# Patient Record
Sex: Male | Born: 1980
Health system: Southern US, Community
[De-identification: ages and names within clinical notes are randomized; demographics above are authoritative.]

## PROBLEM LIST (undated history)

## (undated) DIAGNOSIS — T7840XA Allergy, unspecified, initial encounter: Secondary | ICD-10-CM

## (undated) DIAGNOSIS — F988 Other specified behavioral and emotional disorders with onset usually occurring in childhood and adolescence: Secondary | ICD-10-CM

## (undated) DIAGNOSIS — I10 Essential (primary) hypertension: Secondary | ICD-10-CM

## (undated) HISTORY — DX: Essential (primary) hypertension: I10

## (undated) HISTORY — PX: INGUINAL HERNIA REPAIR: SHX194

## (undated) HISTORY — DX: Allergy, unspecified, initial encounter: T78.40XA

## (undated) HISTORY — DX: Other specified behavioral and emotional disorders with onset usually occurring in childhood and adolescence: F98.8

---

## 2002-07-10 ENCOUNTER — Emergency Department (HOSPITAL_COMMUNITY): Admission: EM | Admit: 2002-07-10 | Discharge: 2002-07-11 | Payer: Self-pay | Admitting: Emergency Medicine

## 2003-06-03 ENCOUNTER — Emergency Department (HOSPITAL_COMMUNITY): Admission: AD | Admit: 2003-06-03 | Discharge: 2003-06-03 | Payer: Self-pay | Admitting: *Deleted

## 2003-06-09 ENCOUNTER — Ambulatory Visit (HOSPITAL_BASED_OUTPATIENT_CLINIC_OR_DEPARTMENT_OTHER): Admission: RE | Admit: 2003-06-09 | Discharge: 2003-06-09 | Payer: Self-pay | Admitting: General Surgery

## 2004-06-09 ENCOUNTER — Emergency Department (HOSPITAL_COMMUNITY): Admission: EM | Admit: 2004-06-09 | Discharge: 2004-06-10 | Payer: Self-pay | Admitting: Emergency Medicine

## 2004-10-22 ENCOUNTER — Emergency Department (HOSPITAL_COMMUNITY): Admission: EM | Admit: 2004-10-22 | Discharge: 2004-10-22 | Payer: Self-pay | Admitting: Emergency Medicine

## 2006-01-28 ENCOUNTER — Ambulatory Visit: Payer: Self-pay | Admitting: Internal Medicine

## 2006-03-05 ENCOUNTER — Ambulatory Visit: Payer: Self-pay | Admitting: Internal Medicine

## 2008-01-11 ENCOUNTER — Ambulatory Visit: Payer: Self-pay | Admitting: Internal Medicine

## 2008-01-11 DIAGNOSIS — F988 Other specified behavioral and emotional disorders with onset usually occurring in childhood and adolescence: Secondary | ICD-10-CM | POA: Insufficient documentation

## 2008-01-11 LAB — CONVERTED CEMR LAB
ALT: 35 units/L (ref 0–53)
AST: 53 units/L — ABNORMAL HIGH (ref 0–37)
Albumin: 4.4 g/dL (ref 3.5–5.2)
Alkaline Phosphatase: 66 units/L (ref 39–117)
BUN: 13 mg/dL (ref 6–23)
Basophils Absolute: 0.1 10*3/uL (ref 0.0–0.1)
Basophils Relative: 0.9 % (ref 0.0–1.0)
Bilirubin Urine: NEGATIVE
Bilirubin, Direct: 0.1 mg/dL (ref 0.0–0.3)
CO2: 30 meq/L (ref 19–32)
Calcium: 9.4 mg/dL (ref 8.4–10.5)
Chloride: 101 meq/L (ref 96–112)
Cholesterol: 153 mg/dL (ref 0–200)
Creatinine, Ser: 1 mg/dL (ref 0.4–1.5)
Eosinophils Absolute: 0.6 10*3/uL (ref 0.0–0.7)
Eosinophils Relative: 9.5 % — ABNORMAL HIGH (ref 0.0–5.0)
GFR calc Af Amer: 117 mL/min
GFR calc non Af Amer: 97 mL/min
Glucose, Bld: 87 mg/dL (ref 70–99)
HCT: 42.7 % (ref 39.0–52.0)
HDL: 61.6 mg/dL (ref >39.0)
Hemoglobin, Urine: NEGATIVE
Hemoglobin: 15.1 g/dL (ref 13.0–17.0)
Ketones, ur: NEGATIVE mg/dL
LDL Cholesterol: 79 mg/dL (ref 0–99)
Leukocytes, UA: NEGATIVE
Lymphocytes Relative: 28.3 % (ref 12.0–46.0)
MCHC: 35.3 g/dL (ref 30.0–36.0)
MCV: 86.6 fL (ref 78.0–100.0)
Monocytes Absolute: 0.7 10*3/uL (ref 0.1–1.0)
Monocytes Relative: 11 % (ref 3.0–12.0)
Neutro Abs: 3.5 10*3/uL (ref 1.4–7.7)
Neutrophils Relative %: 50.3 % (ref 43.0–77.0)
Nitrite: NEGATIVE
Platelets: 199 10*3/uL (ref 150–400)
Potassium: 3.8 meq/L (ref 3.5–5.1)
RBC: 4.93 M/uL (ref 4.22–5.81)
RDW: 12.7 % (ref 11.5–14.6)
Sodium: 139 meq/L (ref 135–145)
Specific Gravity, Urine: 1.01 (ref 1.000–1.03)
TSH: 1.59 microintl units/mL (ref 0.35–5.50)
Total Bilirubin: 0.8 mg/dL (ref 0.3–1.2)
Total CHOL/HDL Ratio: 2.5
Total Protein, Urine: NEGATIVE mg/dL
Total Protein: 7.5 g/dL (ref 6.0–8.3)
Triglycerides: 61 mg/dL (ref 0–149)
Urine Glucose: NEGATIVE mg/dL
Urobilinogen, UA: 0.2 (ref 0.0–1.0)
VLDL: 12 mg/dL (ref 0–40)
WBC: 6.8 10*3/uL (ref 4.5–10.5)
pH: 6.5 (ref 5.0–8.0)

## 2008-01-12 LAB — CONVERTED CEMR LAB

## 2008-01-28 ENCOUNTER — Telehealth (INDEPENDENT_AMBULATORY_CARE_PROVIDER_SITE_OTHER): Payer: Self-pay | Admitting: *Deleted

## 2008-02-02 ENCOUNTER — Ambulatory Visit: Payer: Self-pay | Admitting: Endocrinology

## 2008-02-02 DIAGNOSIS — N342 Other urethritis: Secondary | ICD-10-CM | POA: Insufficient documentation

## 2008-02-03 ENCOUNTER — Encounter: Payer: Self-pay | Admitting: Internal Medicine

## 2008-02-03 ENCOUNTER — Telehealth: Payer: Self-pay | Admitting: Endocrinology

## 2008-02-15 ENCOUNTER — Telehealth (INDEPENDENT_AMBULATORY_CARE_PROVIDER_SITE_OTHER): Payer: Self-pay | Admitting: *Deleted

## 2008-09-15 ENCOUNTER — Emergency Department (HOSPITAL_COMMUNITY): Admission: EM | Admit: 2008-09-15 | Discharge: 2008-09-15 | Payer: Self-pay | Admitting: Emergency Medicine

## 2009-02-21 ENCOUNTER — Ambulatory Visit: Payer: Self-pay | Admitting: Internal Medicine

## 2009-02-21 DIAGNOSIS — R1084 Generalized abdominal pain: Secondary | ICD-10-CM | POA: Insufficient documentation

## 2009-02-21 DIAGNOSIS — M549 Dorsalgia, unspecified: Secondary | ICD-10-CM | POA: Insufficient documentation

## 2009-02-21 LAB — CONVERTED CEMR LAB
ALT: 11 units/L (ref 0–53)
AST: 13 units/L (ref 0–37)
Albumin: 4 g/dL (ref 3.5–5.2)
Alkaline Phosphatase: 54 units/L (ref 39–117)
BUN: 15 mg/dL (ref 6–23)
Basophils Absolute: 0 10*3/uL (ref 0.0–0.1)
Basophils Relative: 0.1 % (ref 0.0–3.0)
Bilirubin Urine: NEGATIVE
Bilirubin, Direct: 0.2 mg/dL (ref 0.0–0.3)
CO2: 31 meq/L (ref 19–32)
Calcium: 9.4 mg/dL (ref 8.4–10.5)
Chloride: 102 meq/L (ref 96–112)
Creatinine, Ser: 1 mg/dL (ref 0.4–1.5)
Eosinophils Absolute: 0.3 10*3/uL (ref 0.0–0.7)
Eosinophils Relative: 3.4 % (ref 0.0–5.0)
GFR calc non Af Amer: 95.48 mL/min (ref >60)
Glucose, Bld: 94 mg/dL (ref 70–99)
HCT: 41.8 % (ref 39.0–52.0)
Hemoglobin, Urine: NEGATIVE
Hemoglobin: 14.3 g/dL (ref 13.0–17.0)
Ketones, ur: NEGATIVE mg/dL
Leukocytes, UA: NEGATIVE
Lipase: 6 units/L — ABNORMAL LOW (ref 11.0–59.0)
Lymphocytes Relative: 18.3 % (ref 12.0–46.0)
Lymphs Abs: 1.8 10*3/uL (ref 0.7–4.0)
MCHC: 34.3 g/dL (ref 30.0–36.0)
MCV: 85.9 fL (ref 78.0–100.0)
Mono Screen: NEGATIVE
Monocytes Absolute: 0.9 10*3/uL (ref 0.1–1.0)
Monocytes Relative: 9 % (ref 3.0–12.0)
Neutro Abs: 6.7 10*3/uL (ref 1.4–7.7)
Neutrophils Relative %: 69.2 % (ref 43.0–77.0)
Nitrite: NEGATIVE
Platelets: 199 10*3/uL (ref 150.0–400.0)
Potassium: 4.3 meq/L (ref 3.5–5.1)
RBC: 4.87 M/uL (ref 4.22–5.81)
RDW: 12.3 % (ref 11.5–14.6)
Sodium: 139 meq/L (ref 135–145)
Specific Gravity, Urine: 1.02 (ref 1.000–1.030)
Total Bilirubin: 0.8 mg/dL (ref 0.3–1.2)
Total Protein, Urine: NEGATIVE mg/dL
Total Protein: 7.1 g/dL (ref 6.0–8.3)
Urine Glucose: NEGATIVE mg/dL
Urobilinogen, UA: 0.2 (ref 0.0–1.0)
WBC: 9.7 10*3/uL (ref 4.5–10.5)
pH: 7 (ref 5.0–8.0)

## 2010-11-13 LAB — URINALYSIS, ROUTINE W REFLEX MICROSCOPIC
Bilirubin Urine: NEGATIVE
Glucose, UA: NEGATIVE mg/dL
Hgb urine dipstick: NEGATIVE
Ketones, ur: NEGATIVE mg/dL
Nitrite: NEGATIVE
Protein, ur: NEGATIVE mg/dL
Specific Gravity, Urine: 1.025 (ref 1.005–1.030)
Urobilinogen, UA: 0.2 mg/dL (ref 0.0–1.0)
pH: 6.5 (ref 5.0–8.0)

## 2010-11-13 LAB — GC/CHLAMYDIA PROBE AMP, GENITAL
Chlamydia, DNA Probe: NEGATIVE
GC Probe Amp, Genital: NEGATIVE

## 2010-12-14 NOTE — Op Note (Signed)
Clayton Olson, Clayton Olson                         ACCOUNT NO.:  [id number]   MEDICAL RECORD NO.:  [id number]                   PATIENT TYPE:  AMB   LOCATION:  DSC                                  FACILITY:  [facility]   PHYSICIAN:  Anselm Pancoast. Zachery Dakins, M.D.          DATE OF BIRTH:  February 13, 1981   DATE OF PROCEDURE:  06/09/2003  DATE OF DISCHARGE:                                 OPERATIVE REPORT   PREOPERATIVE DIAGNOSIS:  Right inguinal hernia, indirect.   POSTOPERATIVE DIAGNOSIS:  Right inguinal hernia, indirect.   PROCEDURE:  Right inguinal herniorrhaphy with mesh reinforcement.   ANESTHESIA:  General anesthesia with local supplement.   SURGEON:  Anselm Pancoast. Zachery Dakins, M.D.   HISTORY:  The patient is a 30 year old Caucasian male who was referred to  our office for a symptomatic right inguinal hernia.  The patient states that  he works in a [employer] but recently has started doing  weight lifting and has noticed a bulge in the right groin.  This is an  indirect hernia that reduces easily and he said that he has been having pain  and a little bit of discomfort in the groin for approximately a year.  Recently, he had a cough that was a lot more symptomatic in the right groin  and this is what prompted him to have the herniorrhaphy at this time.  On  physical examination I could feel a definite bulge with straining in the  right groin that reduces spontaneously.  He has a very positive family  history of the males having hernias at young ages.   DESCRIPTION OF PROCEDURE:  The patient preoperatively was given a gram of  Kefzol and taken to the operative suite.  He only weighs about 120 pounds.  An LOA tube was used.  The groin was first clipped and then prepped with  Betadine surgical solution.  A small inguinal incision was made and sharp  dissection down through the skin, subcutaneous and Scarpa's fascia.  The  superficial vein was clamped, divided and ligated with fine  Vicryl.  The  external oblique aponeurosis was opened through the external ring.  There  was a little ilioinguinal nerve that was protected and I elevated the cord  structures.  The floor was reasonably strong.  You could see a little  indirect sac coming down with the cord structures and this was separated in  the medial superior aspect and then the hernia sac which was about three  inches in length was opened and a high sac ligation was performed with 2-0  silk.  The sac was removed and retracted nicely.  I then used about three  sutures of 2-0 silk to kind of reinforce the internal ring.  I then took a  small piece of Prolene mesh shaped like a sail slit laterally to reinforce  the floor.  This was sutured to dartos and symphysis pubis with a  running 2-  0 Prolene at the inguinal ligament and then the superior flap was sutured  with interrupted sutures of 2-0 Vicryl so that he will not feel the little  knots in this area.  The cord structures were lying comfortably.  The two  tails were kind of reinforced to the internal ring and the ilioinguinal  nerve was brought with the cord structures.  The external oblique was then  closed with a running 3-0 Vicryl.  Scarpa's was closed with interrupted 3-0  Vicryl and a 5-0 Dexon subcuticularly.  Benzoin and Steri-Strips on the  skin.  The patient tolerated the procedure nicely.  The testicle is in its  normal position.  I infiltrated the ilioinguinal area with a  blunted 22 gauge needle initially of about 10 mL of Marcaine and then a  little Marcaine placed in the ilioinguinal ligament area to avoid immediate  postoperative discomfort.  He will be released after a short stay and hopes  to return to work in approximately two weeks.  I will see him in the office  for a follow-up in about ten days.                                               Anselm Pancoast. Zachery Dakins, M.D.    WJW/MEDQ  D:  06/09/2003  T:  06/09/2003  Job:  [id number]

## 2011-02-14 ENCOUNTER — Encounter: Payer: Self-pay | Admitting: Internal Medicine

## 2011-02-15 ENCOUNTER — Other Ambulatory Visit (INDEPENDENT_AMBULATORY_CARE_PROVIDER_SITE_OTHER): Payer: Self-pay

## 2011-02-15 ENCOUNTER — Encounter: Payer: Self-pay | Admitting: Internal Medicine

## 2011-02-15 DIAGNOSIS — Z0000 Encounter for general adult medical examination without abnormal findings: Secondary | ICD-10-CM | POA: Insufficient documentation

## 2011-02-15 DIAGNOSIS — Z0001 Encounter for general adult medical examination with abnormal findings: Secondary | ICD-10-CM | POA: Insufficient documentation

## 2011-02-15 LAB — BASIC METABOLIC PANEL
BUN: 17 mg/dL (ref 6–23)
CO2: 27 mEq/L (ref 19–32)
Calcium: 9.2 mg/dL (ref 8.4–10.5)
Chloride: 105 mEq/L (ref 96–112)
Creatinine, Ser: 1 mg/dL (ref 0.4–1.5)
GFR: 96.32 mL/min (ref >60.00)
Glucose, Bld: 86 mg/dL (ref 70–99)
Potassium: 3.7 mEq/L (ref 3.5–5.1)
Sodium: 139 mEq/L (ref 135–145)

## 2011-02-15 LAB — URINALYSIS
Bilirubin Urine: NEGATIVE
Hgb urine dipstick: NEGATIVE
Ketones, ur: NEGATIVE
Leukocytes, UA: NEGATIVE
Nitrite: NEGATIVE
Specific Gravity, Urine: 1.015 (ref 1.000–1.030)
Total Protein, Urine: NEGATIVE
Urine Glucose: NEGATIVE
Urobilinogen, UA: 0.2 (ref 0.0–1.0)
pH: 6.5 (ref 5.0–8.0)

## 2011-02-15 LAB — HEPATIC FUNCTION PANEL
ALT: 18 U/L (ref 0–53)
AST: 20 U/L (ref 0–37)
Albumin: 4.8 g/dL (ref 3.5–5.2)
Alkaline Phosphatase: 62 U/L (ref 39–117)
Bilirubin, Direct: 0.2 mg/dL (ref 0.0–0.3)
Total Bilirubin: 1.2 mg/dL (ref 0.3–1.2)
Total Protein: 7.7 g/dL (ref 6.0–8.3)

## 2011-02-15 LAB — CBC WITH DIFFERENTIAL/PLATELET
Basophils Absolute: 0.1 10*3/uL (ref 0.0–0.1)
Basophils Relative: 1.4 % (ref 0.0–3.0)
Eosinophils Absolute: 1.1 10*3/uL — ABNORMAL HIGH (ref 0.0–0.7)
Eosinophils Relative: 17.8 % — ABNORMAL HIGH (ref 0.0–5.0)
HCT: 45.9 % (ref 39.0–52.0)
Hemoglobin: 15.8 g/dL (ref 13.0–17.0)
Lymphocytes Relative: 36.7 % (ref 12.0–46.0)
Lymphs Abs: 2.1 10*3/uL (ref 0.7–4.0)
MCHC: 34.5 g/dL (ref 30.0–36.0)
MCV: 85.5 fl (ref 78.0–100.0)
Monocytes Absolute: 0.6 10*3/uL (ref 0.1–1.0)
Monocytes Relative: 10.6 % (ref 3.0–12.0)
Neutro Abs: 1.9 10*3/uL (ref 1.4–7.7)
Neutrophils Relative %: 32.9 % — ABNORMAL LOW (ref 43.0–77.0)
Platelets: 193 10*3/uL (ref 150.0–400.0)
RBC: 5.37 Mil/uL (ref 4.22–5.81)
RDW: 13.1 % (ref 11.5–14.6)
WBC: 5.7 10*3/uL (ref 4.5–10.5)

## 2011-02-15 LAB — TSH: TSH: 1.36 u[IU]/mL (ref 0.35–5.50)

## 2011-02-15 LAB — LIPID PANEL
Cholesterol: 160 mg/dL (ref 0–200)
HDL: 67.3 mg/dL (ref >39.00)
LDL Cholesterol: 84 mg/dL (ref 0–99)
Total CHOL/HDL Ratio: 2
Triglycerides: 45 mg/dL (ref 0.0–149.0)
VLDL: 9 mg/dL (ref 0.0–40.0)

## 2011-02-18 ENCOUNTER — Encounter: Payer: Self-pay | Admitting: Internal Medicine

## 2011-02-18 ENCOUNTER — Ambulatory Visit (INDEPENDENT_AMBULATORY_CARE_PROVIDER_SITE_OTHER): Payer: 59 | Admitting: Internal Medicine

## 2011-02-18 VITALS — BP 110/80 | HR 67 | Temp 98.8°F | Ht 67.0 in | Wt 155.5 lb

## 2011-02-18 DIAGNOSIS — J309 Allergic rhinitis, unspecified: Secondary | ICD-10-CM | POA: Insufficient documentation

## 2011-02-18 DIAGNOSIS — Z0000 Encounter for general adult medical examination without abnormal findings: Secondary | ICD-10-CM

## 2011-02-18 DIAGNOSIS — K921 Melena: Secondary | ICD-10-CM

## 2011-02-18 MED ORDER — HYDROCORTISONE ACETATE 25 MG RE SUPP: 25.0000 mg | Freq: Two times a day (BID) | RECTAL | Status: AC

## 2011-02-18 NOTE — Progress Notes (Signed)
Subjective:    Patient ID: Clayton Olson, male    DOB: 02/13/1981, 30 y.o.   MRN: [id number]  HPI Here for wellness and f/u;  Overall doing ok;  Pt denies CP, worsening SOB, DOE, wheezing, orthopnea, PND, worsening LE edema, palpitations, dizziness or syncope.  Pt denies neurological change such as new Headache, facial or extremity weakness.  Pt denies polydipsia, polyuria, or low sugar symptoms. Pt states overall good compliance with treatment and medications, good tolerability, and trying to follow lower cholesterol diet.  Pt denies worsening depressive symptoms, suicidal ideation or panic. No fever, wt loss, night sweats, loss of appetite, or other constitutional symptoms.  Pt states good ability with ADL's, low fall risk, home safety reviewed and adequate, no significant changes in hearing or vision, and occasionally active with exercise. Does get some itchy rash to arms bilat, not clear if from sweating. Also with recent mult small episodes BRBPR with known hemorrhoids, no abd pain, and due to frequency lately is reqeusting GI eval.  Past Medical History  Diagnosis Date  . ADD (attention deficit disorder)    Past Surgical History  Procedure Date  . Inguinal hernia repair     Right    reports that he has never smoked. He does not have any smokeless tobacco history on file. He reports that he drinks alcohol. His drug history not on file. family history includes Depression in his mother and Hypertension in his father and mother. No Known Allergies Current Outpatient Prescriptions on File Prior to Visit  Medication Sig Dispense Refill  . azithromycin (ZITHROMAX Z-PAK) 250 MG tablet Take 250 mg by mouth daily.        . fexofenadine (ALLEGRA) 180 MG tablet Take 180 mg by mouth daily.        . propoxyphene-acetaminophen (DARVOCET-N 100) 100-650 MG per tablet Take 1 tablet by mouth every 6 (six) hours as needed.         Review of Systems Review of Systems  Constitutional: Negative for  diaphoresis, activity change, appetite change and unexpected weight change.  HENT: Negative for hearing loss, ear pain, facial swelling, mouth sores and neck stiffness.   Eyes: Negative for pain, redness and visual disturbance.  Respiratory: Negative for shortness of breath and wheezing.   Cardiovascular: Negative for chest pain and palpitations.  Gastrointestinal: Negative for diarrhea, blood in stool, abdominal distention and rectal pain.  Genitourinary: Negative for hematuria, flank pain and decreased urine volume.  Musculoskeletal: Negative for myalgias and joint swelling.  Skin: Negative for color change and wound.  Neurological: Negative for syncope and numbness.  Hematological: Negative for adenopathy.  Psychiatric/Behavioral: Negative for hallucinations, self-injury, decreased concentration and agitation.     Objective:   Physical Exam Physical Exam  VS noted Constitutional: Pt is oriented to person, place, and time. Appears well-developed and well-nourished.  HENT:  Head: Normocephalic and atraumatic.  Right Ear: External ear normal.  Left Ear: External ear normal.  Nose: Nose normal.  Mouth/Throat: Oropharynx is clear and moist.  Eyes: Conjunctivae and EOM are normal. Pupils are equal, round, and reactive to light.  Neck: Normal range of motion. Neck supple. No JVD present. No tracheal deviation present.  Cardiovascular: Normal rate, regular rhythm, normal heart sounds and intact distal pulses.   Pulmonary/Chest: Effort normal and breath sounds normal.  Abdominal: Soft. Bowel sounds are normal. There is no tenderness.  Musculoskeletal: Normal range of motion. Exhibits no edema.  Lymphadenopathy:  Has no cervical adenopathy.  Neurological: Pt is alert  and oriented to person, place, and time. Pt has normal reflexes. No cranial nerve deficit.  Skin: Skin is warm and dry. No rash noted.  Psychiatric:  Has  normal mood and affect. Behavior is normal. 1+ nervous        Assessment & Plan:

## 2011-02-18 NOTE — Patient Instructions (Signed)
Take all new medications as prescribed Continue all other medications as before You will be contacted regarding the referral for: Gastroenterology  

## 2011-02-18 NOTE — Assessment & Plan Note (Signed)
prob hemorrhoidal I suspect, but frequent and pt concerned, will rx anusol HC, refer GI per pt reqeust

## 2011-02-18 NOTE — Assessment & Plan Note (Signed)
Overall doing well, age appropriate education and counseling updated, referrals for preventative services and immunizations addressed, dietary and smoking counseling addressed, most recent labs and ECG reviewed.  I have personally reviewed and have noted: 1) the patient's medical and social history 2) The pt's use of alcohol, tobacco, and illicit drugs 3) The patient's current medications and supplements 4) Functional ability including ADL's, fall risk, home safety risk, hearing and visual impairment 5) Diet and physical activities 6) Evidence for depression or mood disorder 7) The patient's height, weight, and BMI have been recorded in the chart I have made referrals, and provided counseling and education based on review of the above Decliens tetanus today

## 2011-02-21 ENCOUNTER — Encounter: Payer: Self-pay | Admitting: Internal Medicine

## 2011-03-19 ENCOUNTER — Ambulatory Visit (INDEPENDENT_AMBULATORY_CARE_PROVIDER_SITE_OTHER): Payer: 59 | Admitting: Internal Medicine

## 2011-03-19 ENCOUNTER — Encounter: Payer: Self-pay | Admitting: Internal Medicine

## 2011-03-19 VITALS — BP 110/70 | HR 76 | Ht 66.5 in | Wt 157.0 lb

## 2011-03-19 DIAGNOSIS — K921 Melena: Secondary | ICD-10-CM

## 2011-03-19 NOTE — Patient Instructions (Signed)
You have been scheduled for a Flexible Sigmoidoscopy without sedation. See Separate instructions.  cc: Oliver Barre, MD

## 2011-03-19 NOTE — Progress Notes (Signed)
Subjective:    Patient ID: Clayton Olson, male    DOB: November 05, 1980, 30 y.o.   MRN: [id number]  HPI Clayton Olson is a 30 year old male seen in consultation at the request of Dr. Jonny Ruiz for rectal bleeding. The patient reports he's had intermittent rectal bleeding for the last several years in the past has been told this was likely due to hemorrhoids. He notes that 2-3 weeks ago he developed more frequent rectal bleeding. He reports that this was always with bowel movement and has been painless. He has noticed some perianal itching. He reports that he was often see bright red blood streaks on the end of his stool but occasionally seen blood-stained toilet water.  He also reports blood on the polyp tissue. He denies constipation or diarrhea and says that he has one to 2 bowel movements per day. He denies hard stool or the need to strain at stool.  He reports no abdominal pain. He's had no nausea or vomiting. He reports a good appetite and stable weight. He was given a prescription for Anusol suppository but he did not want to use these. He reports rather use a cream, therefore has been using nothing of late.  He's had no fevers chills or night sweats.  PMH: ADD Allergic rhinitis  Review of Systems Constitutional: Negative for fever, chills, night sweats, activity change, appetite change and unexpected weight change HEENT: Negative for sore throat, mouth sores and trouble swallowing. Eyes: Negative for visual disturbance Respiratory: Negative for cough, chest tightness and shortness of breath Cardiovascular: Negative for chest pain, palpitations and lower extremity swelling Gastrointestinal: See history of present illness Genitourinary: Negative for dysuria and hematuria. Musculoskeletal: Negative for back pain, arthralgias and myalgias Skin: Negative for rash or color change Neurological: Negative for headaches, weakness, numbness Hematological: Negative for adenopathy, negative for easy  bruising/bleeding Psychiatric/behavioral: Negative for depressed mood, negative for anxiety   Social History  . Marital Status: Married   Occupational History  . [employer]   .  [employer]   Social History Main Topics  . Smoking status: Former Smoker    Types: Cigarettes  . Smokeless tobacco: Never Used  . Alcohol Use: Yes     occass  . Drug Use: No   Family History  Problem Relation Age of Onset  . Hypertension Mother   . Hypertension Father   . Depression Mother   . Alcohol abuse    . Colon cancer      grandparent  . Breast cancer    . Heart disease      grandparent  . Stroke      grandparent  . Kidney disease    . Diabetes        Objective:   Physical Exam BP 110/70  Pulse 76  Ht 5' 6.5" (1.689 m)  Wt 157 lb (71.215 kg)  BMI 24.96 kg/m2 Constitutional: Well-developed and well-nourished. No distress. HEENT: Normocephalic and atraumatic. Oropharynx is clear and moist. No oropharyngeal exudate. Conjunctivae are normal. Pupils are equal round and reactive to light. No scleral icterus. Neck: Neck supple. Trachea midline. Cardiovascular: Normal rate, regular rhythm and intact distal pulses. No M/R/G Pulmonary/chest: Effort normal and breath sounds normal. No wheezing, rales or rhonchi. Abdominal: Soft, nontender, nondistended. There are no masses palpable. No hepatosplenomegaly. Lymphadenopathy: No cervical adenopathy noted. Neurological: Alert and oriented to person place and time. Skin: Skin is warm and dry. No rashes noted. Psychiatric: Normal mood and affect. Behavior is normal.  CBC  Component Value Date/Time   WBC 5.7 02/15/2011 1610   RBC 5.37 02/15/2011 1610   HGB 15.8 02/15/2011 1610   HCT 45.9 02/15/2011 1610   PLT 193.0 02/15/2011 1610   MCV 85.5 02/15/2011 1610   MCHC 34.5 02/15/2011 1610   RDW 13.1 02/15/2011 1610   LYMPHSABS 2.1 02/15/2011 1610   MONOABS 0.6 02/15/2011 1610   EOSABS 1.1* 02/15/2011 1610   BASOSABS 0.1  02/15/2011 1610   CMET     Component Value Date/Time   NA 139 02/15/2011 1610   K 3.7 02/15/2011 1610   CL 105 02/15/2011 1610   CO2 27 02/15/2011 1610   GLUCOSE 86 02/15/2011 1610   BUN 17 02/15/2011 1610   CREATININE 1.0 02/15/2011 1610   CALCIUM 9.2 02/15/2011 1610   PROT 7.7 02/15/2011 1610   ALBUMIN 4.8 02/15/2011 1610   AST 20 02/15/2011 1610   ALT 18 02/15/2011 1610   ALKPHOS 62 02/15/2011 1610   BILITOT 1.2 02/15/2011 1610   GFRNONAA 95.48 02/21/2009 1557   GFRAA 117 01/11/2008 1544      Assessment & Plan:  This is a 30 year old male with a past medical history of attention deficit disorder and allergic rhinitis presenting for intermittent bright red blood per rectum.  1. BRBPR - the likely cause of the patient's painless bright red blood per rectum is hemorrhoidal bleeding. We discussed hemorrhoidal bleeding and the options for treating with topical therapy such as hydrocortisone. He would like the reassurance provided by flexible sigmoidoscopy and so we will schedule this for him in the coming few weeks. It doesn't seem like constipation is an issue and I have advised that we will to take steps to avoid this should it become so. If indeed we find internal hemorrhoids we will provide topical therapy for him at the time of procedure.   Return will likely be as needed after procedure

## 2011-04-10 ENCOUNTER — Telehealth: Payer: Self-pay | Admitting: Internal Medicine

## 2011-04-10 NOTE — Telephone Encounter (Signed)
Spoke with pt and he states that he now wants to have sedation for his flexible sigmoidoscopy tomorrow.  Informed him that this would not be a problem.  He will discuss with Dr. Rhea Belton tomorrow before procedure tomorrow.  Advised him that he would have to have a carepartner with him tomorrow that must stay during the procedure.  He states he is aware of our policy.

## 2011-04-11 ENCOUNTER — Ambulatory Visit (AMBULATORY_SURGERY_CENTER): Payer: 59 | Admitting: Internal Medicine

## 2011-04-11 ENCOUNTER — Encounter: Payer: Self-pay | Admitting: Internal Medicine

## 2011-04-11 VITALS — BP 116/74 | HR 61 | Temp 98.0°F | Resp 16 | Ht 66.0 in | Wt 151.0 lb

## 2011-04-11 DIAGNOSIS — K921 Melena: Secondary | ICD-10-CM

## 2011-04-11 MED ORDER — SODIUM CHLORIDE 0.9 % IV SOLN: 500.0000 mL | INTRAVENOUS | Status: DC

## 2011-04-11 NOTE — Patient Instructions (Signed)
Please refer to your blue and neon green sheets for instructions regarding diet and activity for the rest of today.  You may resume your medications as you would normally take them.  

## 2011-04-12 ENCOUNTER — Telehealth: Payer: Self-pay | Admitting: *Deleted

## 2011-04-12 NOTE — Telephone Encounter (Signed)
Message left on voice mail with ID.

## 2012-03-26 ENCOUNTER — Emergency Department (INDEPENDENT_AMBULATORY_CARE_PROVIDER_SITE_OTHER)
Admission: EM | Admit: 2012-03-26 | Discharge: 2012-03-26 | Disposition: A | Discharge status: Home / Self Care | Payer: Self-pay | Source: Home / Self Care | Attending: Family Medicine | Admitting: Family Medicine

## 2012-03-26 ENCOUNTER — Encounter (HOSPITAL_COMMUNITY): Payer: Self-pay | Admitting: *Deleted

## 2012-03-26 DIAGNOSIS — M542 Cervicalgia: Secondary | ICD-10-CM

## 2012-03-26 DIAGNOSIS — Z041 Encounter for examination and observation following transport accident: Secondary | ICD-10-CM

## 2012-03-26 MED ORDER — DICLOFENAC POTASSIUM 50 MG PO TABS: 50.0000 mg | ORAL_TABLET | Freq: Three times a day (TID) | ORAL | Status: DC

## 2012-03-26 MED ORDER — CYCLOBENZAPRINE HCL 5 MG PO TABS: 5.0000 mg | ORAL_TABLET | Freq: Three times a day (TID) | ORAL | Status: AC | PRN
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
## 2012-11-20 ENCOUNTER — Encounter: Payer: Self-pay | Admitting: Medical

## 2012-11-20 ENCOUNTER — Ambulatory Visit (INDEPENDENT_AMBULATORY_CARE_PROVIDER_SITE_OTHER): Payer: 59 | Admitting: Medical

## 2012-11-20 VITALS — BP 110/80 | HR 67 | Temp 98.3°F | Resp 16 | Wt 159.0 lb

## 2012-11-20 DIAGNOSIS — H9319 Tinnitus, unspecified ear: Secondary | ICD-10-CM

## 2012-11-20 DIAGNOSIS — H9313 Tinnitus, bilateral: Secondary | ICD-10-CM

## 2012-11-20 DIAGNOSIS — M25561 Pain in right knee: Secondary | ICD-10-CM

## 2012-11-20 DIAGNOSIS — H9190 Unspecified hearing loss, unspecified ear: Secondary | ICD-10-CM

## 2012-11-20 DIAGNOSIS — M25569 Pain in unspecified knee: Secondary | ICD-10-CM

## 2012-11-20 DIAGNOSIS — H9193 Unspecified hearing loss, bilateral: Secondary | ICD-10-CM

## 2012-11-20 MED ORDER — HYDROCHLOROTHIAZIDE 12.5 MG PO TABS: 12.5000 mg | ORAL_TABLET | Freq: Every day | ORAL | Status: DC

## 2012-11-20 NOTE — Patient Instructions (Addendum)
Begin Allegra or Zyrtec at bedtime for allergies.   Begin Hydrochlorothiazide (fluid pill) in the morning daily  Lets see if this doesn't take care of the ringing.   Recheck in 2 weeks.  If not improving, we will consider other evaluation.  Go for the knee xray.   Meniere's Disease You have Meniere's disease. Meniere's disease is a term for the recurrent symptoms (problems) of vertigo (the room or you seem to spin around), tinnitus (ringing in the ears), and hearing loss. The cause is unknown. These episodes often come on suddenly and without warning. They are sometimes associated with nausea (feeling sick to your stomach) and vomiting. There is no cure. A number of strategies may help the symptoms. Surgical help is available if conservative measures fail. HOME CARE INSTRUCTIONS   If you smoke, STOP.  Restrict your salt intake.  Stop caffeine consumption (caffeinated coffee, sodas, and chocolate).  Do not drive during or near the time of attacks.  Over the counter Antivert (meclizine) at a 25 mg dosage 3 times per day may be helpful. SEEK IMMEDIATE MEDICAL CARE IF:   Nausea and vomiting are continuous.  You have no relief from vertigo. MAKE SURE YOU:   Understand these instructions.  Will watch your condition.  Will get help right away if you are not doing well or get worse. Document Released: 07/12/2000 Document Revised: 10/07/2011 Document Reviewed: 07/15/2005 [facility] Patient Information 2013 [place], [place].   Patellofemoral Pain Your exam shows your knee pain is probably due to a problem with the knee cap, the patella. This problem is also called patellofemoral pain, runner's knee, or chondromalacia. Most of the time, this problem is due to overuse of the knee joint. Repeated bending and straightening can irritate the underside of the knee cap. When this happens, activities such as running, walking, climbing, biking or jumping usually produce pain. Pain may also occur  after prolonged sitting. Other patellofemoral symptoms can include joint stiffness, swelling, and a snapping or grinding sensation with movement. Rest and rehabilitation are usually successful in treating this problem. Surgery is rarely needed. Treatment includes correcting any mechanical factors that could hurt the normal working of the knee. This could be weak thigh muscles or foot problems. Avoid repetitive activities of the knee until the pain and other symptoms improve. Apply ice packs over the knee for 20 to 30 minutes every 2 to 4 hours to reduce pain and swelling. Only take over-the-counter or prescription medicines for pain, discomfort, or fever as directed by your caregiver. Knee braces or neoprene sleeves may help reduce irritation. Rehabilitation exercises to strengthen the quad muscle are often prescribed when your symptoms are better. Call your caregiver for a follow-up exam to evaluate your response to treatment. Document Released: 08/22/2004 Document Revised: 10/07/2011 Document Reviewed: 07/15/2005 [facility] Patient Information 2013 [place], [place].

## 2012-11-20 NOTE — Progress Notes (Signed)
Subjective: Here as a new patient.  He reports ringing in ears x 1 days, but last week had felt sensation of liquid in ears.  No prior similar.  Having some stopped up nose.  Using nothing for symptoms.  Denies sinus pressure, headache, sore throat, no itchy watery eyes.   No hx/o vertigo.   No hearing loss or dizziness.   Use to listen to loud music, wanted hearing checked.   Having some knee pain, right for 10 years.  Pain is intermittent, but will go for periods with pain.  Had MRI in the past, concern about prior tears, but they weren't sure.   MRI 8 years ago.   Worse with running, but no other particular worsening factors.   No numbness, tingling, weakness.   Past Medical History  Diagnosis Date  . ADD (attention deficit disorder)   . Hemorrhoids   . Allergy    ROS as in subjective   Objective: Filed Vitals:   11/20/12 1514  BP: 110/80  Pulse: 67  Temp: 98.3 F (36.8 C)  Resp: 16    Objective: Gen:wd, wn, nad white male Skin: warm dry, no erythema or ecchymosis HEENT: normocephalic, sclerae anicteric, TMs pearly, nares patent, no discharge or erythema, pharynx normal Oral cavity: MMM, no lesions Neck: supple, no lymphadenopathy, no thyromegaly, no masses Heart: RRR, normal S1, S2, no murmurs Lungs: CTA bilaterally, no wheezes, rhonchi, or rales Pulses: 2+ symmetric, upper and lower extremities, normal cap refill MSK: mid grinding felt with knee extension, but nontneder, no swelling, no laxity, normal exam otherwise, rest of LE unremarkable Neuro: leg strength, DTRs, sensation normal   Assessment: Encounter Diagnoses  Name Primary?  . Tinnitus of both ears Yes  . Knee pain, right      Plan: Tinnitus - suspect meniere.  Begin HCTZ, allegra, rest, good hydration, recheck 2wk.  Knee pain - go for xray.  Do the rehab exercise I demonstrated for likely patellofemoral syndrome.  OTC Aleve prn.   Go for the knee xray.  Follow-up pending xray

## 2012-12-02 ENCOUNTER — Ambulatory Visit
Admission: RE | Admit: 2012-12-02 | Discharge: 2012-12-02 | Disposition: A | Discharge status: Home / Self Care | Payer: 59 | Source: Ambulatory Visit | Attending: Medical | Admitting: Medical

## 2012-12-02 DIAGNOSIS — M25561 Pain in right knee: Secondary | ICD-10-CM

## 2013-02-10 ENCOUNTER — Telehealth: Payer: Self-pay | Admitting: Medical

## 2013-02-10 ENCOUNTER — Emergency Department (INDEPENDENT_AMBULATORY_CARE_PROVIDER_SITE_OTHER)
Admission: EM | Admit: 2013-02-10 | Discharge: 2013-02-10 | Disposition: A | Discharge status: Home / Self Care | Payer: 59 | Source: Home / Self Care

## 2013-02-10 ENCOUNTER — Encounter (HOSPITAL_COMMUNITY): Payer: Self-pay | Admitting: Emergency Medicine

## 2013-02-10 DIAGNOSIS — T148XXA Other injury of unspecified body region, initial encounter: Secondary | ICD-10-CM

## 2013-02-10 MED ORDER — MELOXICAM 15 MG PO TABS: 15.0000 mg | ORAL_TABLET | Freq: Every day | ORAL | Status: DC | PRN
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
## 2013-05-07 ENCOUNTER — Encounter (HOSPITAL_COMMUNITY): Payer: Self-pay | Admitting: Emergency Medicine

## 2013-05-07 ENCOUNTER — Emergency Department (INDEPENDENT_AMBULATORY_CARE_PROVIDER_SITE_OTHER)
Admission: EM | Admit: 2013-05-07 | Discharge: 2013-05-07 | Disposition: A | Discharge status: Home / Self Care | Payer: 59 | Source: Home / Self Care | Attending: Family Medicine | Admitting: Family Medicine

## 2013-05-07 DIAGNOSIS — J020 Streptococcal pharyngitis: Secondary | ICD-10-CM

## 2013-05-07 LAB — POCT RAPID STREP A: Streptococcus, Group A Screen (Direct): POSITIVE — AB

## 2013-05-07 MED ORDER — PENICILLIN G BENZATHINE 1200000 UNIT/2ML IM SUSP
INTRAMUSCULAR | Status: AC
  Filled 2013-05-07: qty 2

## 2013-05-07 MED ORDER — PENICILLIN G BENZATHINE 1200000 UNIT/2ML IM SUSP
1.2000 10*6.[IU] | Freq: Once | INTRAMUSCULAR | Status: AC
  Administered 2013-05-07: 1.2 10*6.[IU] via INTRAMUSCULAR

## 2013-05-07 NOTE — ED Notes (Signed)
Patient aware of post injection delay secondary to antibiotic injection

## 2013-05-07 NOTE — ED Provider Notes (Signed)
Clayton Olson is a 32 y.o. male who presents to Urgent Care today for sore throat runny nose cough and congestion present for 4 days and worsening. Patient denies any nausea vomiting diarrhea fevers chills or trouble breathing. He's tried some over-the-counter cold medications which have not helped much. He feels well otherwise.   Past Medical History  Diagnosis Date  . ADD (attention deficit disorder)   . Hemorrhoids   . Allergy    History  Substance Use Topics  . Smoking status: Former Smoker    Types: Cigarettes  . Smokeless tobacco: Never Used  . Alcohol Use: Yes     Comment: occass   ROS as above Medications reviewed. No current facility-administered medications for this encounter.   Current Outpatient Prescriptions  Medication Sig Dispense Refill  . fexofenadine (ALLEGRA) 180 MG tablet Take 180 mg by mouth daily.        . hydrochlorothiazide (HYDRODIURIL) 12.5 MG tablet Take 1 tablet (12.5 mg total) by mouth daily.  30 tablet  0    Exam:  BP 133/82  Pulse 67  Temp(Src) 98.1 F (36.7 C) (Oral)  Resp 16  SpO2 98% Gen: Well NAD HEENT: EOMI,  MMM, tympanic membranes are normal appearing bilaterally. Posterior pharynx is erythematous with cobblestoning without exudate. Lungs: CTABL Nl WOB Heart: RRR no MRG Abd: NABS, NT, ND Exts: Non edematous BL  LE, warm and well perfused.   Results for orders placed during the hospital encounter of 05/07/13 (from the past 24 hour(s))  POCT RAPID STREP A ([facility] ONLY)     Status: Abnormal   Collection Time    05/07/13 12:56 PM      Result Value Range   Streptococcus, Group A Screen (Direct) POSITIVE (*) NEGATIVE   No results found.  Assessment and Plan: 32 y.o. male with strep pharyngitis. Plan to treat with penicillin injection as well as pain medications. Followup if not improving Discussed warning signs or symptoms. Please see discharge instructions. Patient expresses understanding.      Clayton Bong,  MD 05/07/13 1318

## 2013-05-07 NOTE — ED Notes (Signed)
Sore throat for 4 days

## 2013-08-07 ENCOUNTER — Emergency Department (INDEPENDENT_AMBULATORY_CARE_PROVIDER_SITE_OTHER)
Admission: EM | Admit: 2013-08-07 | Discharge: 2013-08-07 | Disposition: A | Discharge status: Home / Self Care | Payer: 59 | Source: Home / Self Care | Attending: Emergency Medicine | Admitting: Emergency Medicine

## 2013-08-07 ENCOUNTER — Encounter (HOSPITAL_COMMUNITY): Payer: Self-pay | Admitting: Emergency Medicine

## 2013-08-07 ENCOUNTER — Emergency Department (INDEPENDENT_AMBULATORY_CARE_PROVIDER_SITE_OTHER): Payer: 59

## 2013-08-07 DIAGNOSIS — G589 Mononeuropathy, unspecified: Secondary | ICD-10-CM

## 2013-08-07 DIAGNOSIS — G629 Polyneuropathy, unspecified: Secondary | ICD-10-CM

## 2013-08-07 MED ORDER — GABAPENTIN (ONCE-DAILY) 300 MG PO TABS: ORAL_TABLET | ORAL | Status: DC

## 2013-08-07 MED ORDER — MELOXICAM 15 MG PO TABS: 15.0000 mg | ORAL_TABLET | Freq: Every day | ORAL | Status: DC

## 2013-08-07 NOTE — ED Notes (Signed)
Patient reports side/hip pain for 2 weeks.  Pain does not radiate to the front or back.  Denies injury.

## 2013-08-07 NOTE — Discharge Instructions (Signed)
Avoid any compression around waist.   Pinched Nerve The term pinched nerve describes one type of damage or injury to a nerve or set of nerves. Pinched nerves can sometimes lead to other conditions. These include peripheral neuropathy, carpal tunnel syndrome, and tennis elbow. The extent of such injuries may vary from minor, temporary damage to a more permanent condition. Early diagnosis is important to prevent further damage or complications. Pinched nerve is a common cause of on-the-job injury. CAUSES  The injury may result from:  Compression.  Constriction.  Stretching. SYMPTOMS  Symptoms include:  Numbness.  "Pins and needles" or burning sensations.  Pain radiating outward from the injured area.  One of the most common examples of a single compressed nerve is the feeling of having a foot or hand "fall asleep." TREATMENT  The most often recommended treatment for pinched nerve is rest for the affected area. Corticosteroids help alleviate pain. In some cases, surgery is recommended. Physical therapy may be recommended. Splints or collars may be used. With treatment, most people recover from pinched nerve. In some cases, the damage is irreversible. Document Released: 07/05/2002 Document Revised: 10/07/2011 Document Reviewed: 06/22/2008 [facility] Patient Information 2014 [place], [place].

## 2013-08-07 NOTE — ED Provider Notes (Signed)
Chief Complaint    Chief Complaint  Patient presents with  . Hip Pain    History of Present Illness     Clayton Olson is a 33 year old male who's had a two-week history of discomfort in the right hip area overlying the iliac crest. He describes a skin sensitivity to touch. Rubbing in this area produces a pain and a numbness that radiates down towards the anterior thigh, but not into the thigh. There is no swelling, bruising, or skin rash present. It does not hurt to walk or to stand. He denies any injury or trauma. He thinks this might have come on after wearing a tight belt around the area.  Review of Systems     Other than as noted above, the patient denies any of the following symptoms: Systemic:  No fevers, chills, sweats, or muscle aches.  No weight loss.  Musculoskeletal:  No joint pain, arthritis, bursitis, swelling, back pain, or neck pain. Neurological:  No muscular weakness, paresthesias, headache, or trouble with speech or coordination.  No dizziness.  [name]    Past medical history, family history, social history, meds, and allergies were reviewed.   Physical Exam    Vital signs:  BP 113/62  Pulse 66  Temp(Src) 98 F (36.7 C) (Oral)  Resp 18  SpO2 100% Gen:  Alert and oriented times 3.  In no distress. Musculoskeletal: Exam of the hip reveals a minimally tender area to palpation just anterior to the iliac crest. There was no swelling, bruising, or deformity, the hip has a full range of motion with no pain. Straight leg raising was negative. Corky Sox and Fadir maneuvers were negative.  Otherwise, all joints had a full a ROM with no swelling, bruising or deformity.  No edema, pulses full. Extremities were warm and pink.  Capillary refill was brisk.  Skin:  Clear, warm and dry.  No rash. Neuro:  Alert and oriented times 3.  Muscle strength was normal.  Sensation was intact to light touch.    Radiology     Dg Hip Complete Right  08/07/2013   CLINICAL DATA:  Right  lateral hip pain and numbness.  EXAM: RIGHT HIP - COMPLETE 2+ VIEW  COMPARISON:  None.  FINDINGS: There is no evidence of hip fracture or dislocation. There is no evidence of arthropathy. No bony lesions or destruction identified. Soft tissues are unremarkable.  IMPRESSION: Negative.   Electronically Signed   By: Aletta Edouard M.D.   On: 08/07/2013 14:38   I reviewed the images independently and personally and concur with the radiologist's findings.  Assessment    The encounter diagnosis was Neuropathy.  Probably due to compression from clothing. This should heal up this time.  Plan   1.  Meds:  The following meds were prescribed:   Discharge Medication List as of 08/07/2013  2:52 PM    START taking these medications   Details  Gabapentin, PHN, 300 MG TABS 1 daily for 5 days, 1 BID for 5 days, then 1 TID, Normal    meloxicam (MOBIC) 15 MG tablet Take 1 tablet (15 mg total) by mouth daily., Starting 08/07/2013, Until Discontinued, Normal        2.  Patient Education/Counseling:  The patient was given appropriate handouts, self care instructions, and instructed in symptomatic relief, including rest and activity, elevation, application of ice and compression.  Advised not to wear anything tight around the area such as a tight belt or tight pants.  3.  Follow up:  The patient was told to follow up here if no better in 3 to 4 days, or sooner if becoming worse in any way, and given some red flag symptoms such as worsening pain or new neurological symptoms which would prompt immediate return.  Follow up here if necessary.     Harden Mo, MD 08/07/13 2135

## 2014-10-08 IMAGING — CR DG KNEE COMPLETE 4+V*R*
4 series · 4 of 4 positions shown · non-contrast
Comparison: Radiographs 06/10/2004.  MRI 11/06/2005.

CLINICAL DATA: Chronic intermittent anterior knee pain.

RIGHT KNEE - COMPLETE 4+ VIEW

[view not recorded (1 of 4)]
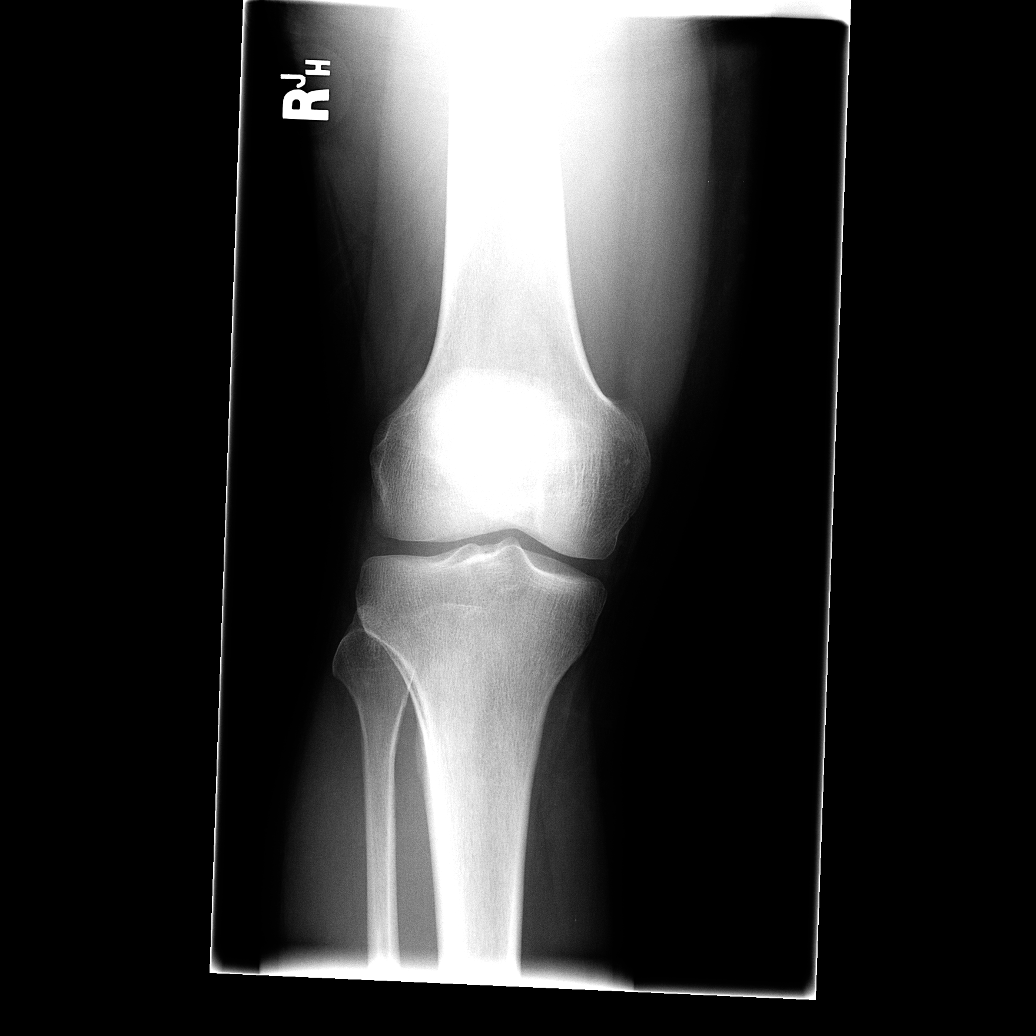

[view not recorded (2 of 4)]
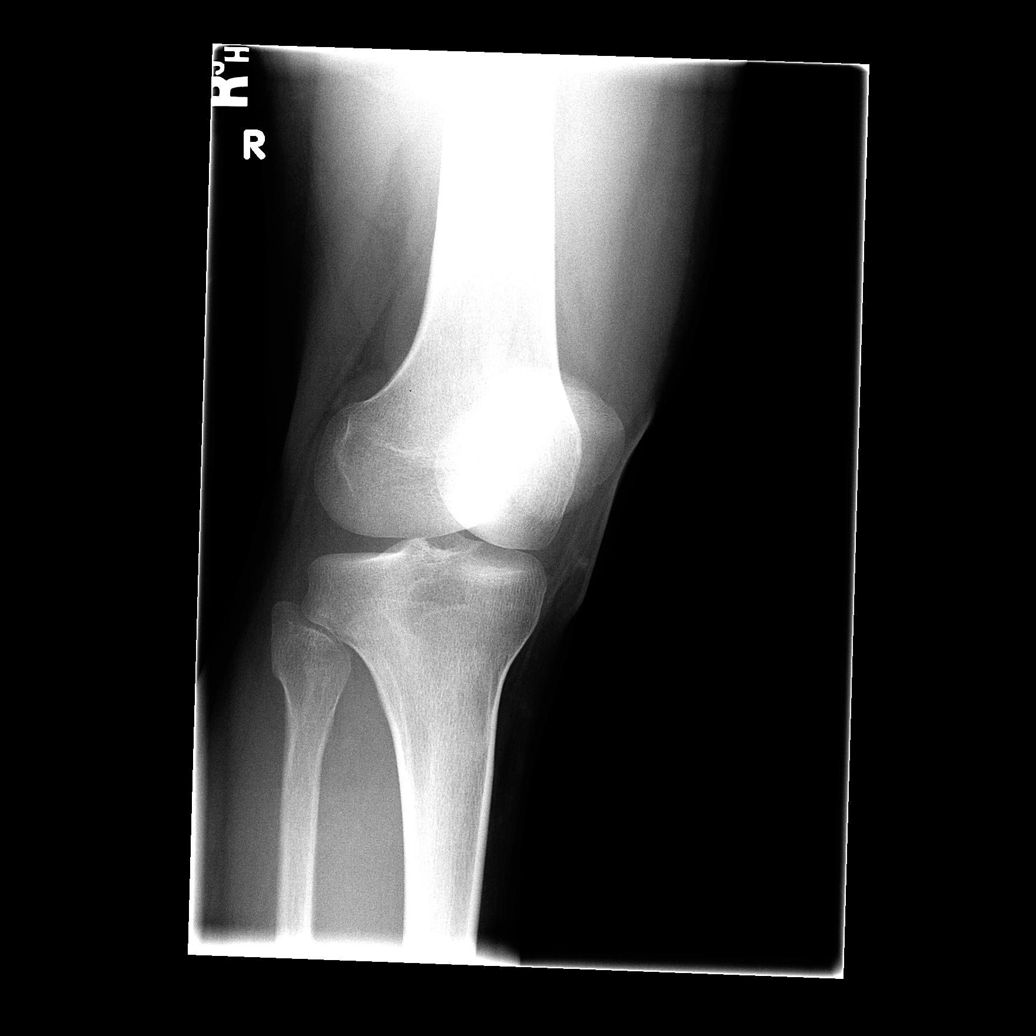

[view not recorded (3 of 4)]
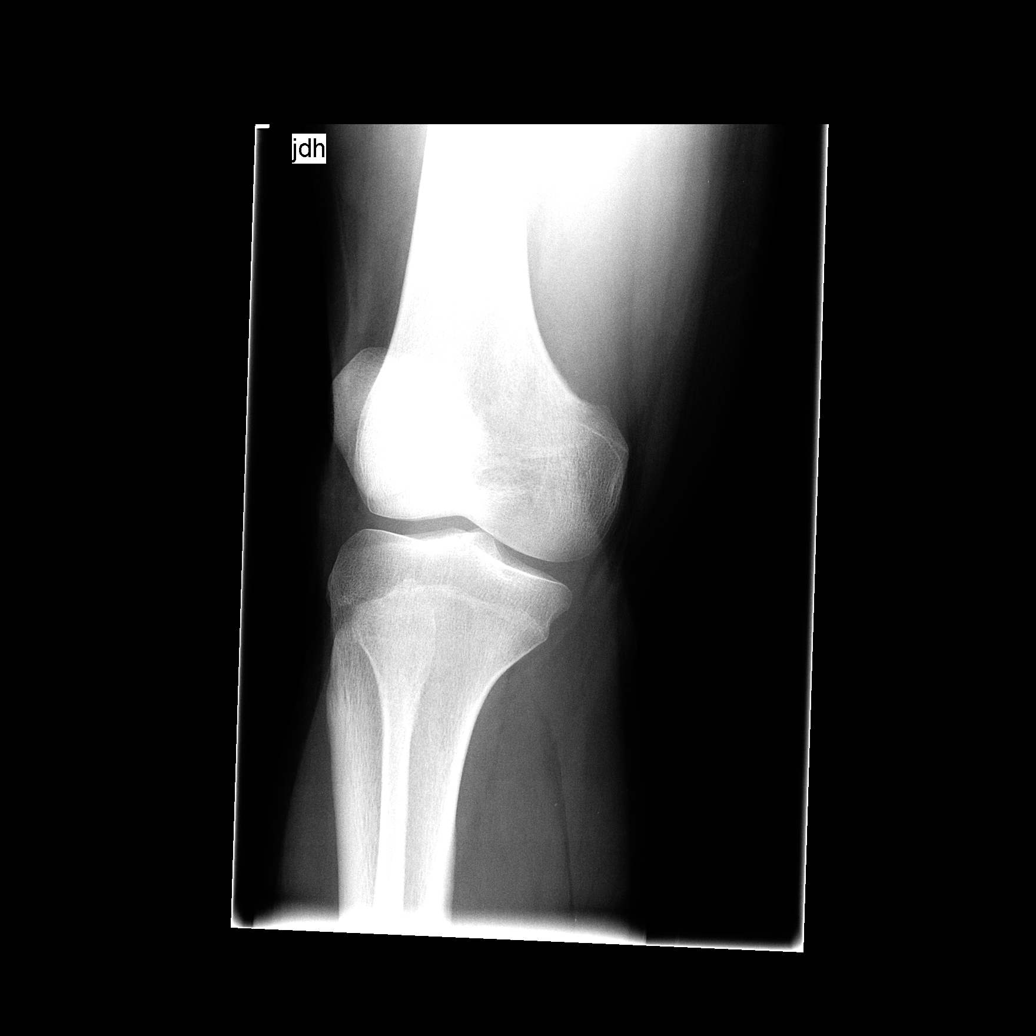

[view not recorded (4 of 4)]
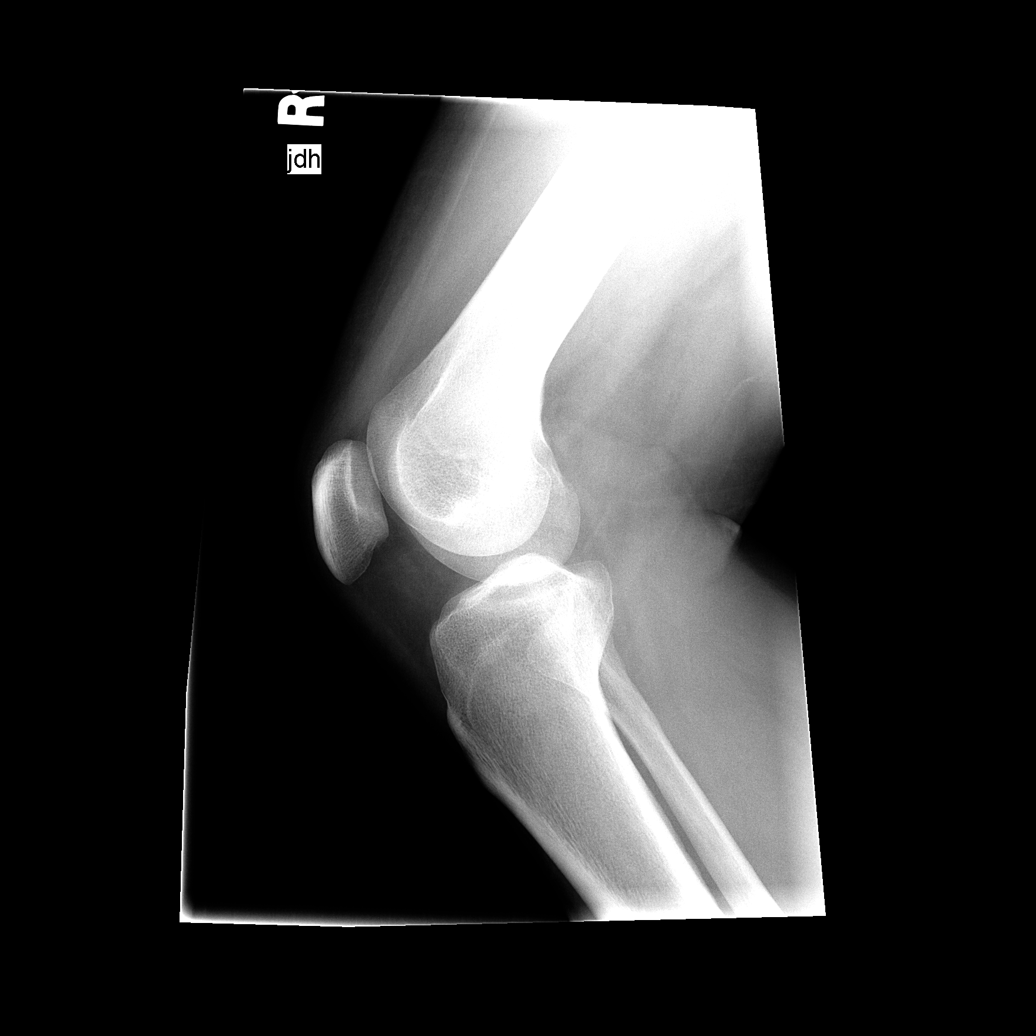

[4 of 4 positions shown; findings below may reference images not displayed]

FINDINGS: The mineralization and alignment are normal.  There is no
evidence of acute fracture or dislocation.  The joint spaces are
maintained.  No osseous erosion or joint effusion is demonstrated.
IMPRESSION: Stable normal radiographic appearance of the right knee.

## 2016-02-08 ENCOUNTER — Telehealth: Payer: Self-pay | Admitting: Internal Medicine

## 2016-02-08 NOTE — Telephone Encounter (Signed)
Ok with me 

## 2016-02-08 NOTE — Telephone Encounter (Signed)
Pt request to reestablish with Dr. Jenny Reichmann to get a CPE. Please advise, he has [employer] for insurance.

## 2016-02-09 NOTE — Telephone Encounter (Signed)
Pt stated he will call back to rs

## 2016-05-21 ENCOUNTER — Other Ambulatory Visit (HOSPITAL_COMMUNITY)
Admission: RE | Admit: 2016-05-21 | Discharge: 2016-05-21 | Disposition: A | Discharge status: Home / Self Care | Payer: 59 | Source: Ambulatory Visit | Attending: Family Medicine | Admitting: Family Medicine

## 2016-05-21 ENCOUNTER — Encounter: Payer: Self-pay | Admitting: Family Medicine

## 2016-05-21 ENCOUNTER — Ambulatory Visit (INDEPENDENT_AMBULATORY_CARE_PROVIDER_SITE_OTHER): Payer: 59 | Admitting: Family Medicine

## 2016-05-21 VITALS — BP 133/94 | HR 81 | Temp 98.4°F | Ht 68.1 in | Wt 172.0 lb

## 2016-05-21 DIAGNOSIS — Z113 Encounter for screening for infections with a predominantly sexual mode of transmission: Secondary | ICD-10-CM

## 2016-05-21 DIAGNOSIS — Z0000 Encounter for general adult medical examination without abnormal findings: Secondary | ICD-10-CM

## 2016-05-21 NOTE — Assessment & Plan Note (Signed)
Doing well today. Counseled on healthy lifestyle including balanced diet and regular exercise. Will check basic screening labs. Will also check for STDs per patient request.

## 2016-05-21 NOTE — Progress Notes (Signed)
    Subjective:  Clayton Olson is a 35 y.o. male who presents to the [facility] today with a chief complaint of wellness visit / establish care.    HPI:  Wellness Eats balanced diet. Exercises every day. Does not smoke. No recreational drug use. PHQ-2 negative.   ROS: All systems reviewed and are negative  PMH:  The following were reviewed and entered/updated in epic: Past Medical History:  Diagnosis Date  . ADD (attention deficit disorder)   . Allergy   . Hemorrhoids    Patient Active Problem List   Diagnosis Date Noted  . Allergic rhinitis, cause unspecified 02/18/2011  . Hematochezia 02/18/2011  . Preventative health care 02/15/2011  . BACK PAIN 02/21/2009  . Other urethritis(597.89) 02/02/2008  . ADD 01/11/2008   Past Surgical History:  Procedure Laterality Date  . INGUINAL HERNIA REPAIR     Right    Family History  Problem Relation Age of Onset  . Hypertension Mother   . Depression Mother   . Hypertension Father   . Alcohol abuse    . Colon cancer      grandparent  . Breast cancer    . Heart disease      grandparent  . Stroke      grandparent  . Kidney disease    . Diabetes      Medications- reviewed and updated Current Outpatient Prescriptions  Medication Sig Dispense Refill  . fexofenadine (ALLEGRA) 180 MG tablet Take 180 mg by mouth daily.       No current facility-administered medications for this visit.     Allergies-reviewed and updated Allergies  Allergen Reactions  . Peanuts [Peanut Oil]     Hazel Nuts    Social History   Social History  . Marital status: Married    Spouse name: N/A  . Number of children: 0  . Years of education: N/A   Occupational History  . [employer]   .  [employer]   Social History Main Topics  . Smoking status: Former Smoker    Types: Cigarettes  . Smokeless tobacco: Never Used  . Alcohol use Yes     Comment: occass  . Drug use: No  . Sexual activity: Not Asked   Other Topics  Concern  . None   Social History Narrative  . None     Objective:  Physical Exam: BP (!) 133/94   Pulse 81   Temp 98.4 F (36.9 C) (Oral)   Ht 5' 8.1" (1.73 m)   Wt 172 lb (78 kg)   BMI 26.08 kg/m   Gen: NAD, resting comfortably CV: RRR with no murmurs appreciated Pulm: NWOB, CTAB with no crackles, wheezes, or rhonchi GI: Normal bowel sounds present. Soft, Nontender, Nondistended. MSK: no edema, cyanosis, or clubbing noted Skin: warm, dry Neuro: grossly normal, moves all extremities Psych: Normal affect and thought content  Assessment/Plan:  Preventative health care Doing well today. Counseled on healthy lifestyle including balanced diet and regular exercise. Will check basic screening labs. Will also check for STDs per patient request.    Algis Greenhouse. Jerline Pain, [facility] Resident PGY-3 05/21/2016 5:06 PM

## 2016-05-21 NOTE — Patient Instructions (Signed)
Please come back to have your blood work done.  We will complete your form at that time.   Come back to see me in 12 months for your next physical, or sooner if you need anything else.  Take care,  Dr Jerline Pain

## 2016-05-22 ENCOUNTER — Other Ambulatory Visit: Payer: 59

## 2016-05-22 ENCOUNTER — Telehealth: Payer: Self-pay | Admitting: Family Medicine

## 2016-05-22 DIAGNOSIS — Z0000 Encounter for general adult medical examination without abnormal findings: Secondary | ICD-10-CM

## 2016-05-22 LAB — LIPID PANEL
Cholesterol: 170 mg/dL (ref 125–200)
HDL: 60 mg/dL (ref >=40)
LDL Cholesterol: 94 mg/dL (ref <130)
Total CHOL/HDL Ratio: 2.8 Ratio (ref <=5.0)
Triglycerides: 81 mg/dL (ref <150)
VLDL: 16 mg/dL (ref <30)

## 2016-05-22 LAB — CBC
HCT: 45.3 % (ref 38.5–50.0)
HEMOGLOBIN: 15.4 g/dL (ref 13.2–17.1)
MCH: 29 pg (ref 27.0–33.0)
MCHC: 34 g/dL (ref 32.0–36.0)
MCV: 85.3 fL (ref 80.0–100.0)
MPV: 11.7 fL (ref 7.5–12.5)
Platelets: 229 10*3/uL (ref 140–400)
RBC: 5.31 MIL/uL (ref 4.20–5.80)
RDW: 13.3 % (ref 11.0–15.0)
WBC: 6.4 10*3/uL (ref 3.8–10.8)

## 2016-05-22 LAB — URINE CYTOLOGY ANCILLARY ONLY
CHLAMYDIA, DNA PROBE: NEGATIVE
Neisseria Gonorrhea: NEGATIVE

## 2016-05-22 LAB — BASIC METABOLIC PANEL
BUN: 18 mg/dL (ref 7–25)
CALCIUM: 9.5 mg/dL (ref 8.6–10.3)
CO2: 25 mmol/L (ref 20–31)
CREATININE: 1.13 mg/dL (ref 0.60–1.35)
Chloride: 102 mmol/L (ref 98–110)
GLUCOSE: 74 mg/dL (ref 65–99)
Potassium: 4 mmol/L (ref 3.5–5.3)
Sodium: 137 mmol/L (ref 135–146)

## 2016-05-22 LAB — HIV ANTIBODY (ROUTINE TESTING W REFLEX): HIV: NONREACTIVE

## 2016-05-22 LAB — RPR

## 2016-05-22 NOTE — Telephone Encounter (Signed)
Insurance form dropped off for at front desk for completion.  Verified that patient section of form has been completed.  Last DOS with PCP was 05-21-2016.  Placed form in team folder to be completed by clinical staff.  Rosa A Charlies Constable

## 2016-05-23 NOTE — Telephone Encounter (Signed)
Clinical info completed on wellness form.  Place form in Dr. Marigene Ehlers box for completion.  Clayton Olson, [place]

## 2016-05-24 ENCOUNTER — Encounter: Payer: Self-pay | Admitting: Family Medicine

## 2016-05-24 NOTE — Telephone Encounter (Signed)
Pt called and informed that form was ready for pickup at front desk on Monday. Fleeger, Salome Spotted, CMA

## 2016-05-24 NOTE — Telephone Encounter (Signed)
Form completed and placed in Tamika's box. 

## 2016-06-17 ENCOUNTER — Ambulatory Visit (INDEPENDENT_AMBULATORY_CARE_PROVIDER_SITE_OTHER): Payer: 59 | Admitting: Family Medicine

## 2016-06-17 ENCOUNTER — Other Ambulatory Visit (HOSPITAL_COMMUNITY)
Admission: RE | Admit: 2016-06-17 | Discharge: 2016-06-17 | Disposition: A | Discharge status: Home / Self Care | Payer: 59 | Source: Ambulatory Visit | Attending: Family Medicine | Admitting: Family Medicine

## 2016-06-17 ENCOUNTER — Encounter: Payer: Self-pay | Admitting: Family Medicine

## 2016-06-17 VITALS — BP 133/91 | HR 81 | Temp 97.5°F | Ht 68.1 in | Wt 169.2 lb

## 2016-06-17 DIAGNOSIS — Z113 Encounter for screening for infections with a predominantly sexual mode of transmission: Secondary | ICD-10-CM | POA: Diagnosis present

## 2016-06-17 MED ORDER — VALACYCLOVIR HCL 1 G PO TABS: 1000.0000 mg | ORAL_TABLET | Freq: Two times a day (BID) | ORAL | 0 refills | Status: DC

## 2016-06-17 NOTE — Patient Instructions (Signed)
We will let you know the test results when they return. I have sent a prescription for Valtrex to take twice daily for 10 days. Please call for refill if you have any further episodes. Do not have sex without a condom to prevent spread.

## 2016-06-18 LAB — URINE CYTOLOGY ANCILLARY ONLY
CHLAMYDIA, DNA PROBE: NEGATIVE
NEISSERIA GONORRHEA: NEGATIVE

## 2016-06-18 NOTE — Progress Notes (Signed)
Subjective:     Patient ID: Clayton Olson, male   DOB: 09-22-1980, 35 y.o.   MRN: [id number]  HPI Clayton Olson is a 35yo male presenting today for recurring rash on penis. - Rash located on shaft of penis - Appears as small blisters - Notes some burning prior to appearance of rash. Burning improved once rash appeared but is still present - First noted a few days ago - Has had similar rash multiple times in the past - Denies fever, lymphadenopathy - Sexually active without protection. Last intercourse 4-5 days ago.  Review of Systems Per HPI    Objective:   Physical Exam  Constitutional: He is oriented to person, place, and time. He appears well-developed and well-nourished. No distress.  HENT:  Head: Normocephalic and atraumatic.  Mouth/Throat: Oropharynx is clear and moist.  Cardiovascular: Normal rate and regular rhythm.   No murmur heard. Pulmonary/Chest: No respiratory distress. He has no wheezes.  Abdominal: Soft. He exhibits no distension. There is no tenderness.  Neurological: He is alert and oriented to person, place, and time.  Skin:  Vesicular rash noted on shaft of penis  Psychiatric: He has a normal mood and affect.      Assessment and Plan:     1. Screen for STD (sexually transmitted disease) - HSV culture obtained  - Gonorrhea/Chlamydia negative - Initiate Valacyclovir at treatment dose - Recommended avoiding intercourse during acute flare. To use protection to prevent spread. - Return if symptoms worsen or fail to resolve

## 2016-06-19 LAB — HERPES SIMPLEX VIRUS CULTURE: ORGANISM ID, BACTERIA: DETECTED

## 2016-07-17 ENCOUNTER — Telehealth: Payer: Self-pay

## 2016-07-17 NOTE — Telephone Encounter (Signed)
Pt called asking for lab results. Informed him of Type 2 herpes positive, chlamydia neg and gonorrhea neg.  Pt said he needs more medicine, but wants a long term med. Wants to know if we tested him for type 1 herpes as well as type 2. Please advise. Ottis Stain, CMA

## 2016-07-18 MED ORDER — VALACYCLOVIR HCL 500 MG PO TABS: 500.0000 mg | ORAL_TABLET | Freq: Two times a day (BID) | ORAL | 0 refills | Status: AC

## 2016-07-18 NOTE — Addendum Note (Signed)
Addended by: Lorna Few on: 07/18/2016 01:25 PM   Modules accepted: Orders

## 2016-07-18 NOTE — Telephone Encounter (Signed)
Refill of Valtrex at recurrence HSV flare dosing (500mg  twice daily for three days) sent to pharmacy. Would not recommend prophylaxis dosing without first noting how many flares he has over the next several months. Please tell him to let us know if further flares occur so we can document how often they are taking place.

## 2016-07-19 NOTE — Telephone Encounter (Signed)
Pt informed of below. Zimmerman Rumple, April D, CMA  

## 2016-08-13 DIAGNOSIS — Z0000 Encounter for general adult medical examination without abnormal findings: Secondary | ICD-10-CM | POA: Diagnosis not present

## 2016-08-27 DIAGNOSIS — M25572 Pain in left ankle and joints of left foot: Secondary | ICD-10-CM | POA: Diagnosis not present

## 2016-08-27 DIAGNOSIS — M2580 Other specified joint disorders, unspecified joint: Secondary | ICD-10-CM | POA: Diagnosis not present

## 2016-10-31 DIAGNOSIS — D1801 Hemangioma of skin and subcutaneous tissue: Secondary | ICD-10-CM | POA: Diagnosis not present

## 2016-10-31 DIAGNOSIS — D2261 Melanocytic nevi of right upper limb, including shoulder: Secondary | ICD-10-CM | POA: Diagnosis not present

## 2016-10-31 DIAGNOSIS — L821 Other seborrheic keratosis: Secondary | ICD-10-CM | POA: Diagnosis not present

## 2017-08-22 ENCOUNTER — Emergency Department (HOSPITAL_COMMUNITY)
Admission: EM | Admit: 2017-08-22 | Discharge: 2017-08-23 | Disposition: A | Discharge status: Home / Self Care | Payer: 59 | Attending: Emergency Medicine | Admitting: Emergency Medicine

## 2017-08-22 ENCOUNTER — Other Ambulatory Visit: Payer: Self-pay

## 2017-08-22 ENCOUNTER — Encounter (HOSPITAL_COMMUNITY): Payer: Self-pay | Admitting: Emergency Medicine

## 2017-08-22 DIAGNOSIS — L299 Pruritus, unspecified: Secondary | ICD-10-CM | POA: Diagnosis not present

## 2017-08-22 DIAGNOSIS — Z91018 Allergy to other foods: Secondary | ICD-10-CM | POA: Insufficient documentation

## 2017-08-22 DIAGNOSIS — R000 Tachycardia, unspecified: Secondary | ICD-10-CM | POA: Diagnosis not present

## 2017-08-22 DIAGNOSIS — Z87891 Personal history of nicotine dependence: Secondary | ICD-10-CM | POA: Diagnosis not present

## 2017-08-22 DIAGNOSIS — R21 Rash and other nonspecific skin eruption: Secondary | ICD-10-CM | POA: Diagnosis present

## 2017-08-22 DIAGNOSIS — T7840XA Allergy, unspecified, initial encounter: Secondary | ICD-10-CM | POA: Diagnosis not present

## 2017-08-22 DIAGNOSIS — R1110 Vomiting, unspecified: Secondary | ICD-10-CM | POA: Insufficient documentation

## 2017-08-22 LAB — CBC
HCT: 48.2 % (ref 39.0–52.0)
Hemoglobin: 17.6 g/dL — ABNORMAL HIGH (ref 13.0–17.0)
MCH: 29.6 pg (ref 26.0–34.0)
MCHC: 36.5 g/dL — AB (ref 30.0–36.0)
MCV: 81.1 fL (ref 78.0–100.0)
PLATELETS: 244 10*3/uL (ref 150–400)
RBC: 5.94 MIL/uL — ABNORMAL HIGH (ref 4.22–5.81)
RDW: 12.7 % (ref 11.5–15.5)
WBC: 11.1 10*3/uL — ABNORMAL HIGH (ref 4.0–10.5)

## 2017-08-22 LAB — BASIC METABOLIC PANEL
Anion gap: 11 (ref 5–15)
BUN: 14 mg/dL (ref 6–20)
CALCIUM: 9.4 mg/dL (ref 8.9–10.3)
CO2: 23 mmol/L (ref 22–32)
CREATININE: 0.95 mg/dL (ref 0.61–1.24)
Chloride: 105 mmol/L (ref 101–111)
GFR calc non Af Amer: >60 mL/min (ref >60)
Glucose, Bld: 121 mg/dL — ABNORMAL HIGH (ref 65–99)
Potassium: 3.5 mmol/L (ref 3.5–5.1)
Sodium: 139 mmol/L (ref 135–145)

## 2017-08-22 MED ORDER — EPINEPHRINE 0.3 MG/0.3ML IJ SOAJ: 0.3000 mg | Freq: Once | INTRAMUSCULAR | 0 refills | Status: AC

## 2017-08-22 MED ORDER — METHYLPREDNISOLONE SODIUM SUCC 125 MG IJ SOLR
125.0000 mg | Freq: Once | INTRAMUSCULAR | Status: AC
Start: 2017-08-22 — End: 2017-08-22
  Administered 2017-08-22: 125 mg via INTRAVENOUS
  Filled 2017-08-22: qty 2

## 2017-08-22 MED ORDER — FAMOTIDINE IN NACL 20-0.9 MG/50ML-% IV SOLN
20.0000 mg | Freq: Once | INTRAVENOUS | Status: AC
Start: 2017-08-22 — End: 2017-08-22
  Administered 2017-08-22: 20 mg via INTRAVENOUS
  Filled 2017-08-22: qty 50

## 2017-08-22 MED ORDER — PREDNISONE 10 MG (21) PO TBPK: ORAL_TABLET | ORAL | 0 refills | Status: DC

## 2017-08-22 MED ORDER — SODIUM CHLORIDE 0.9 % IV BOLUS (SEPSIS)
500.0000 mL | Freq: Once | INTRAVENOUS | Status: AC
  Administered 2017-08-22: 500 mL via INTRAVENOUS

## 2017-08-22 MED ORDER — ONDANSETRON HCL 4 MG/2ML IJ SOLN
4.0000 mg | Freq: Once | INTRAMUSCULAR | Status: AC
  Administered 2017-08-22: 4 mg via INTRAVENOUS
  Filled 2017-08-22: qty 2

## 2017-08-22 MED ORDER — DIPHENHYDRAMINE HCL 50 MG/ML IJ SOLN
25.0000 mg | Freq: Once | INTRAMUSCULAR | Status: AC
  Administered 2017-08-22: 25 mg via INTRAVENOUS
  Filled 2017-08-22: qty 1

## 2017-08-22 NOTE — ED Provider Notes (Signed)
[facility] DEPT Provider Note   CSN: [id number] Arrival date & time: 08/22/17  2035     History   Chief Complaint Chief Complaint  Patient presents with  . Allergic Reaction    HPI Clayton Olson is a 37 y.o. male.  Pt presents to the ED today with an allergic rxn.  The pt has an allergy to nuts and ate chocolate that had some nuts in it.  The pt tried to take benadryl pta, but he threw it up.  Pt is itching everywhere.      Past Medical History:  Diagnosis Date  . ADD (attention deficit disorder)   . Allergy   . Hemorrhoids     Patient Active Problem List   Diagnosis Date Noted  . Allergic rhinitis, cause unspecified 02/18/2011  . Hematochezia 02/18/2011  . Preventative health care 02/15/2011  . BACK PAIN 02/21/2009  . ADD 01/11/2008    Past Surgical History:  Procedure Laterality Date  . INGUINAL HERNIA REPAIR     Right       Home Medications    Prior to Admission medications   Medication Sig Start Date End Date Taking? Authorizing Provider  EPINEPHrine 0.3 mg/0.3 mL IJ SOAJ injection Inject 0.3 mLs (0.3 mg total) into the muscle once for 1 dose. 08/22/17 08/22/17  Isla Pence, MD  predniSONE (STERAPRED UNI-PAK 21 TAB) 10 MG (21) TBPK tablet Take 6 tabs by mouth daily  for 2 days, then 5 tabs for 2 days, then 4 tabs for 2 days, then 3 tabs for 2 days, 2 tabs for 2 days, then 1 tab by mouth daily for 2 days 08/22/17   Isla Pence, MD    Family History Family History  Problem Relation Age of Onset  . Hypertension Mother   . Depression Mother   . Hypertension Father   . Alcohol abuse Unknown   . Colon cancer Unknown        grandparent  . Breast cancer Unknown   . Heart disease Unknown        grandparent  . Stroke Unknown        grandparent  . Kidney disease Unknown   . Diabetes Unknown     Social History Social History   Tobacco Use  . Smoking status: Former Smoker    Types: Cigarettes  . Smokeless  tobacco: Never Used  Substance Use Topics  . Alcohol use: Yes    Comment: occass  . Drug use: No     Allergies   Peanuts [peanut oil]   Review of Systems Review of Systems  Skin: Positive for rash.  All other systems reviewed and are negative.    Physical Exam Updated Vital Signs BP (!) 147/97 (BP Location: Left Arm)   Pulse (!) 110   Temp (!) 97.5 F (36.4 C) (Oral)   Resp 20   Ht 5\' 7"  (1.702 m)   Wt 79.4 kg (175 lb)   SpO2 100%   BMI 27.41 kg/m   Physical Exam  Constitutional: He is oriented to person, place, and time. He appears well-developed. He appears distressed.  HENT:  Head: Normocephalic and atraumatic.  Right Ear: External ear normal.  Left Ear: External ear normal.  Nose: Nose normal.  Mouth/Throat: Oropharynx is clear and moist.  Eyes: Conjunctivae and EOM are normal. Pupils are equal, round, and reactive to light.  Neck: Normal range of motion. Neck supple.  Cardiovascular: Regular rhythm, normal heart sounds and intact distal pulses. Tachycardia  present.  Pulmonary/Chest: Effort normal and breath sounds normal.  Abdominal: Soft. Bowel sounds are normal.  Musculoskeletal: Normal range of motion.  Neurological: He is alert and oriented to person, place, and time.  Skin: Capillary refill takes less than 2 seconds. Rash noted. Rash is urticarial.  Psychiatric: He has a normal mood and affect. His behavior is normal. Judgment and thought content normal.  Nursing note and vitals reviewed.    ED Treatments / Results  Labs (all labs ordered are listed, but only abnormal results are displayed) Labs Reviewed  CBC - Abnormal; Notable for the following components:      Result Value   WBC 11.1 (*)    RBC 5.94 (*)    Hemoglobin 17.6 (*)    MCHC 36.5 (*)    All other components within normal limits  BASIC METABOLIC PANEL - Abnormal; Notable for the following components:   Glucose, Bld 121 (*)    All other components within normal limits    EKG   EKG Interpretation None       Radiology No results found.  Procedures Procedures (including critical care time)  Medications Ordered in ED Medications  sodium chloride 0.9 % bolus 500 mL (not administered)  diphenhydrAMINE (BENADRYL) injection 25 mg (25 mg Intravenous Given 08/22/17 2210)  ondansetron (ZOFRAN) injection 4 mg (4 mg Intravenous Given 08/22/17 2210)  methylPREDNISolone sodium succinate (SOLU-MEDROL) 125 mg/2 mL injection 125 mg (125 mg Intravenous Given 08/22/17 2210)  famotidine (PEPCID) IVPB 20 mg premix (20 mg Intravenous New Bag/Given 08/22/17 2210)     Initial Impression / Assessment and Plan / ED Course  I have reviewed the triage vital signs and the nursing notes.  Pertinent labs & imaging results that were available during my care of the patient were reviewed by me and considered in my medical decision making (see chart for details).    Pt is much better.  He is back to baseline.  He will be d/c with epi pen and prednisone.  He will be given the number to [facility].  He knows to return if worse.  Final Clinical Impressions(s) / ED Diagnoses   Final diagnoses:  Allergic reaction, initial encounter    ED Discharge Orders        Ordered    EPINEPHrine 0.3 mg/0.3 mL IJ SOAJ injection   Once     08/22/17 2332    predniSONE (STERAPRED UNI-PAK 21 TAB) 10 MG (21) TBPK tablet     08/22/17 2332       Isla Pence, MD 08/22/17 2335

## 2017-08-22 NOTE — Discharge Instructions (Signed)
OTC benadryl and pepcid as needed for allergy.

## 2017-08-22 NOTE — ED Notes (Signed)
Bed: [facility]4 Expected date:  Expected time:  Means of arrival:  Comments: 

## 2017-08-22 NOTE — ED Triage Notes (Signed)
Patient is allergic to nuts. Patient thinks he ate chocolate with nuts in.

## 2017-11-28 DIAGNOSIS — Z302 Encounter for sterilization: Secondary | ICD-10-CM | POA: Diagnosis not present

## 2018-02-16 DIAGNOSIS — M5431 Sciatica, right side: Secondary | ICD-10-CM | POA: Diagnosis not present

## 2018-02-16 DIAGNOSIS — M5432 Sciatica, left side: Secondary | ICD-10-CM | POA: Diagnosis not present

## 2018-02-16 DIAGNOSIS — M461 Sacroiliitis, not elsewhere classified: Secondary | ICD-10-CM | POA: Diagnosis not present

## 2018-02-18 DIAGNOSIS — M5432 Sciatica, left side: Secondary | ICD-10-CM | POA: Diagnosis not present

## 2018-02-18 DIAGNOSIS — M5431 Sciatica, right side: Secondary | ICD-10-CM | POA: Diagnosis not present

## 2018-02-18 DIAGNOSIS — M461 Sacroiliitis, not elsewhere classified: Secondary | ICD-10-CM | POA: Diagnosis not present

## 2018-05-09 DIAGNOSIS — Z23 Encounter for immunization: Secondary | ICD-10-CM | POA: Diagnosis not present

## 2019-08-30 ENCOUNTER — Telehealth: Payer: Self-pay

## 2019-08-30 NOTE — Telephone Encounter (Signed)
Patient calling and states that he used to be a patient of yours. States that he would like to see you again. Would you be willing to accept as a re-establish? Last seen 02/18/2011. CB#: [phone]

## 2019-08-30 NOTE — Telephone Encounter (Signed)
Ok with me 

## 2019-08-31 NOTE — Telephone Encounter (Signed)
LVM for patient to return call to schedule re-est visit.

## 2019-09-02 ENCOUNTER — Encounter: Payer: Self-pay | Admitting: Internal Medicine

## 2019-09-02 ENCOUNTER — Other Ambulatory Visit: Payer: Self-pay

## 2019-09-02 ENCOUNTER — Ambulatory Visit (INDEPENDENT_AMBULATORY_CARE_PROVIDER_SITE_OTHER): Payer: 59 | Admitting: Internal Medicine

## 2019-09-02 VITALS — BP 120/84 | HR 74 | Temp 98.1°F | Ht 67.0 in | Wt 178.0 lb

## 2019-09-02 DIAGNOSIS — Z0001 Encounter for general adult medical examination with abnormal findings: Secondary | ICD-10-CM

## 2019-09-02 DIAGNOSIS — R21 Rash and other nonspecific skin eruption: Secondary | ICD-10-CM | POA: Diagnosis not present

## 2019-09-02 DIAGNOSIS — F988 Other specified behavioral and emotional disorders with onset usually occurring in childhood and adolescence: Secondary | ICD-10-CM

## 2019-09-02 DIAGNOSIS — A6000 Herpesviral infection of urogenital system, unspecified: Secondary | ICD-10-CM | POA: Diagnosis not present

## 2019-09-02 HISTORY — DX: Herpesviral infection of urogenital system, unspecified: A60.00

## 2019-09-02 LAB — CBC WITH DIFFERENTIAL/PLATELET
Basophils Absolute: 0.1 10*3/uL (ref 0.0–0.1)
Basophils Relative: 2.6 % (ref 0.0–3.0)
Eosinophils Absolute: 0.7 10*3/uL (ref 0.0–0.7)
Eosinophils Relative: 13.7 % — ABNORMAL HIGH (ref 0.0–5.0)
HCT: 47.8 % (ref 39.0–52.0)
Hemoglobin: 16.4 g/dL (ref 13.0–17.0)
Lymphocytes Relative: 28.2 % (ref 12.0–46.0)
Lymphs Abs: 1.4 10*3/uL (ref 0.7–4.0)
MCHC: 34.3 g/dL (ref 30.0–36.0)
MCV: 85.5 fl (ref 78.0–100.0)
Monocytes Absolute: 0.5 10*3/uL (ref 0.1–1.0)
Monocytes Relative: 10.5 % (ref 3.0–12.0)
Neutro Abs: 2.2 10*3/uL (ref 1.4–7.7)
Neutrophils Relative %: 45 % (ref 43.0–77.0)
Platelets: 209 10*3/uL (ref 150.0–400.0)
RBC: 5.59 Mil/uL (ref 4.22–5.81)
RDW: 13.1 % (ref 11.5–15.5)
WBC: 5 10*3/uL (ref 4.0–10.5)

## 2019-09-02 LAB — BASIC METABOLIC PANEL
BUN: 16 mg/dL (ref 6–23)
CO2: 29 mEq/L (ref 19–32)
Calcium: 9.6 mg/dL (ref 8.4–10.5)
Chloride: 104 mEq/L (ref 96–112)
Creatinine, Ser: 1.04 mg/dL (ref 0.40–1.50)
GFR: 80.26 mL/min (ref >60.00)
Glucose, Bld: 92 mg/dL (ref 70–99)
Potassium: 4.4 mEq/L (ref 3.5–5.1)
Sodium: 138 mEq/L (ref 135–145)

## 2019-09-02 LAB — URINALYSIS, ROUTINE W REFLEX MICROSCOPIC
Bilirubin Urine: NEGATIVE
Hgb urine dipstick: NEGATIVE
Ketones, ur: NEGATIVE
Leukocytes,Ua: NEGATIVE
Nitrite: NEGATIVE
RBC / HPF: NONE SEEN (ref 0–?)
Specific Gravity, Urine: 1.02 (ref 1.000–1.030)
Total Protein, Urine: NEGATIVE
Urine Glucose: NEGATIVE
Urobilinogen, UA: 0.2 (ref 0.0–1.0)
pH: 7.5 (ref 5.0–8.0)

## 2019-09-02 LAB — HEPATIC FUNCTION PANEL
ALT: 31 U/L (ref 0–53)
AST: 20 U/L (ref 0–37)
Albumin: 4.6 g/dL (ref 3.5–5.2)
Alkaline Phosphatase: 51 U/L (ref 39–117)
Bilirubin, Direct: 0.1 mg/dL (ref 0.0–0.3)
Total Bilirubin: 0.7 mg/dL (ref 0.2–1.2)
Total Protein: 7.4 g/dL (ref 6.0–8.3)

## 2019-09-02 LAB — LIPID PANEL
Cholesterol: 186 mg/dL (ref 0–200)
HDL: 61.9 mg/dL (ref >39.00)
LDL Cholesterol: 110 mg/dL — ABNORMAL HIGH (ref 0–99)
NonHDL: 124.37
Total CHOL/HDL Ratio: 3
Triglycerides: 74 mg/dL (ref 0.0–149.0)
VLDL: 14.8 mg/dL (ref 0.0–40.0)

## 2019-09-02 LAB — TSH: TSH: 1.48 u[IU]/mL (ref 0.35–4.50)

## 2019-09-02 MED ORDER — TRIAMCINOLONE ACETONIDE 0.1 % EX CREA: 1.0000 "application " | TOPICAL_CREAM | Freq: Two times a day (BID) | CUTANEOUS | 2 refills | Status: AC

## 2019-09-02 MED ORDER — AMPHETAMINE-DEXTROAMPHETAMINE 20 MG PO TABS
20.0000 mg | ORAL_TABLET | Freq: Two times a day (BID) | ORAL | 0 refills | Status: DC
Start: 2019-09-02 — End: 2019-10-29

## 2019-09-02 MED ORDER — VALACYCLOVIR HCL 500 MG PO TABS: 1000.0000 mg | ORAL_TABLET | Freq: Two times a day (BID) | ORAL | 5 refills | Status: DC

## 2019-09-02 NOTE — Progress Notes (Addendum)
Subjective:    Patient ID: Clayton Olson, male    DOB: 07/28/1981, 39 y.o.   MRN: [id number]  HPI  Here for wellness and new patient;  Overall doing ok;  Pt denies Chest pain, worsening SOB, DOE, wheezing, orthopnea, PND, worsening LE edema, palpitations, dizziness or syncope.  Pt denies neurological change such as new headache, facial or extremity weakness.  Pt denies polydipsia, polyuria, or low sugar symptoms. Pt states overall good compliance with treatment and medications, good tolerability, and has been trying to follow appropriate diet.  Pt denies worsening depressive symptoms, suicidal ideation or panic. No fever, night sweats, wt loss, loss of appetite, or other constitutional symptoms.  Pt states good ability with ADL's, has low fall risk, home safety reviewed and adequate, no other significant changes in hearing or vision, and only occasionally active with exercise.Has multiple areas extremities with eczema rash asks for tx, Needs ADD med restarted as unable to focus well and complete tasks at work.  Has recurring rash c/w genital herpes.  Past Medical History:  Diagnosis Date  . ADD (attention deficit disorder)   . Allergy   . Hemorrhoids   . Hypertension    Past Surgical History:  Procedure Laterality Date  . INGUINAL HERNIA REPAIR     Right    reports that he has quit smoking. His smoking use included cigarettes. He has never used smokeless tobacco. He reports current alcohol use. He reports that he does not use drugs. family history includes Alcohol abuse in an other family member; Arthritis in his brother and mother; Breast cancer in an other family member; Colon cancer in an other family member; Depression in his brother and mother; Diabetes in an other family member; Hearing loss in his father; Heart disease in an other family member; Hypertension in his brother, father, and mother; Kidney disease in an other family member; Stroke in an other family member. Allergies    Allergen Reactions  . Peanuts [Peanut Oil]     Hazel Nuts   Current Outpatient Medications on File Prior to Visit  Medication Sig Dispense Refill  . Multiple Vitamin (MULTIVITAMIN) tablet Take 1 tablet by mouth daily.    . Omega-3 Fatty Acids (FISH OIL CONCENTRATE PO) Take by mouth.     No current facility-administered medications on file prior to visit.  ' Review of Systems All otherwise neg per pt     Objective:   Physical Exam BP 120/84   Pulse 74   Temp 98.1 F (36.7 C)   Ht 5\' 7"  (123XX123 m)   Wt 178 lb (80.7 kg)   SpO2 99%   BMI 27.88 kg/m  VS noted,  Constitutional: Pt appears in NAD HENT: Head: NCAT.  Right Ear: External ear normal.  Left Ear: External ear normal.  Eyes: . Pupils are equal, round, and reactive to light. Conjunctivae and EOM are normal Nose: without d/c or deformity Neck: Neck supple. Gross normal ROM Cardiovascular: Normal rate and regular rhythm.   Pulmonary/Chest: Effort normal and breath sounds without rales or wheezing.  Abd:  Soft, NT, ND, + BS, no organomegaly Neurological: Pt is alert. At baseline orientation, motor grossly intact Skin: Skin is warm. No rashes, other new lesions, no LE edema Psychiatric: Pt behavior is normal without agitation  Lab Results  Component Value Date   WBC 5.0 09/02/2019   HGB 16.4 09/02/2019   HCT 47.8 09/02/2019   PLT 209.0 09/02/2019   GLUCOSE 92 09/02/2019   CHOL 186 09/02/2019  TRIG 74.0 09/02/2019   HDL 61.90 09/02/2019   LDLCALC 110 (H) 09/02/2019   ALT 31 09/02/2019   AST 20 09/02/2019   NA 138 09/02/2019   K 4.4 09/02/2019   CL 104 09/02/2019   CREATININE 1.04 09/02/2019   BUN 16 09/02/2019   CO2 29 09/02/2019   TSH 1.48 09/02/2019          Assessment & Plan:

## 2019-09-02 NOTE — Assessment & Plan Note (Signed)

## 2019-09-02 NOTE — Assessment & Plan Note (Signed)
For steroid cream prn, to f/u any worsening symptoms or concerns

## 2019-09-02 NOTE — Assessment & Plan Note (Addendum)
For adderall 20 bid  I spent 15 minutes preparing to see the patient by review of recent labs, imaging and procedures, obtaining and reviewing separately obtained history, communicating with the patient and family or caregiver, ordering medications, tests or procedures, and documenting clinical information in the EHR including the differential Dx, treatment, and any further evaluation and other management of ADD, rash, herpes

## 2019-09-02 NOTE — Assessment & Plan Note (Signed)
For valtrex prn

## 2019-09-02 NOTE — Patient Instructions (Signed)
Please take all new medication as prescribed - the cream, the adderall  Please continue all other medications as before, and refills have been done if requested - the valtrex as needed  Please have the pharmacy call with any other refills you may need.  Please continue your efforts at being more active, low cholesterol diet, and weight control.  You are otherwise up to date with prevention measures today.  Please keep your appointments with your specialists as you may have planned  Please go to the LAB at the blood drawing area for the tests to be done  You will be contacted by phone if any changes need to be made immediately.  Otherwise, you will receive a letter about your results with an explanation, but please check with MyChart first.  Please remember to sign up for MyChart if you have not done so, as this will be important to you in the future with finding out test results, communicating by private email, and scheduling acute appointments online when needed.  Please make an Appointment to return for your 1 year visit, or sooner if needed, with Lab testing by Appointment as well, to be done about 3-5 days before at the [facility] (so this is for TWO appointments - please see the scheduling desk as you leave)

## 2019-09-03 ENCOUNTER — Encounter: Payer: Self-pay | Admitting: Internal Medicine

## 2019-10-29 ENCOUNTER — Ambulatory Visit (INDEPENDENT_AMBULATORY_CARE_PROVIDER_SITE_OTHER)
Admission: RE | Admit: 2019-10-29 | Discharge: 2019-10-29 | Disposition: A | Discharge status: Home / Self Care | Payer: 59 | Source: Ambulatory Visit

## 2019-10-29 DIAGNOSIS — L237 Allergic contact dermatitis due to plants, except food: Secondary | ICD-10-CM | POA: Diagnosis not present

## 2019-10-29 MED ORDER — PREDNISONE 10 MG PO TABS: ORAL_TABLET | ORAL | 0 refills | Status: AC

## 2019-10-29 NOTE — Discharge Instructions (Signed)
Start prednisone as directed. You can use triamcinolone as directed on the rash as well. Can continue calamine lotion. Monitor for signs of infection including spreading redness, warmth, pain, pus like drainage, follow up for in person evaluation.  [facility] at [facility]) [place], [place], [place] [address] [phone]  [facility] at [facility] [address] [place], [place] [address] [phone]  [facility] at [facility] [address] [place], [place] [address] [phone]  [facility] at [address], [place], [place], [place] [address] [phone]  [facility] at [address] [place], [place] [address] [phone]  [facility] at [address], [place], [place] [address] [phone]

## 2019-10-29 NOTE — ED Provider Notes (Signed)
Virtual Visit via Video Note:  Clayton Olson  initiated request for Telemedicine visit with [facility] Urgent Care team. I connected with Clayton Olson  on 10/29/2019 at 11:17 AM  for a synchronized telemedicine visit using a video enabled HIPPA compliant telemedicine application. I verified that I am speaking with Clayton Olson  using two identifiers. Clayton Nickles Jodell Cipro, PA-C  was physically located in a [facility] Urgent care site and Clayton Olson was located at a different location.   The limitations of evaluation and management by telemedicine as well as the availability of in-person appointments were discussed. Patient was informed that he  may incur a bill ( including co-pay) for this virtual visit encounter. Clayton Olson  expressed understanding and gave verbal consent to proceed with virtual visit.     History of Present Illness:Clayton Olson  is a 39 y.o. male presents with 2 day history of rash after exposure to poison ivy. States known poison ivy in the yard. Has rash to BUE/BLE that has been worsening. Rash is pruritic in nature without pain, spreading erythema, warmth. Denies fever. Has been using calamine lotion and otc poison ivy cream.  Past Medical History:  Diagnosis Date  . ADD (attention deficit disorder)   . Allergy   . Genital herpes 09/02/2019  . Hemorrhoids   . Hypertension     Allergies  Allergen Reactions  . Peanuts [Peanut Oil]     Clayton Olson        Observations/Objective: General: Well appearing, nontoxic, no acute distress. Sitting comfortably. Head: Normocephalic, atraumatic Eye: No conjunctival injection, eyelid swelling. EOMI ENT: Mucus membranes moist, no lip cracking. No obvious nasal drainage. Pulm: Speaking in full sentences without difficulty. Normal effort. No respiratory distress, accessory muscle use. Skin: exam limited due to video visit. Grouped/scattered rash that is maculopapular or vesicular in nature.  Surrounding erythema.  Neuro:  Normal mental status. Alert and oriented x 3.   Assessment and Plan: Start prednisone as directed. Patient has triamcinolone at home, can use for further symptomatic relief. Return precautions given. Patient expresses understanding and agrees to plan.  Follow Up Instructions:    I discussed the assessment and treatment plan with the patient. The patient was provided an opportunity to ask questions and all were answered. The patient agreed with the plan and demonstrated an understanding of the instructions.   The patient was advised to call back or seek an in-person evaluation if the symptoms worsen or if the condition fails to improve as anticipated.  I provided 15 minutes of non-face-to-face time during this encounter.    Clayton Edwards, PA-C  10/29/2019 11:17 AM         Clayton Edwards, PA-C 10/29/19 1133

## 2020-08-23 ENCOUNTER — Telehealth: Payer: Self-pay | Admitting: Internal Medicine

## 2020-08-23 DIAGNOSIS — Z0001 Encounter for general adult medical examination with abnormal findings: Secondary | ICD-10-CM

## 2020-08-23 DIAGNOSIS — Z0000 Encounter for general adult medical examination without abnormal findings: Secondary | ICD-10-CM

## 2020-08-23 NOTE — Telephone Encounter (Signed)
Ok done

## 2020-08-23 NOTE — Telephone Encounter (Signed)
Pt has CPE scheduled for 2/11 and labs scheduled for 2/7, I do not see any active orders.

## 2020-09-04 ENCOUNTER — Other Ambulatory Visit: Payer: 59

## 2020-09-08 ENCOUNTER — Encounter: Payer: 59 | Admitting: Internal Medicine

## 2020-09-08 ENCOUNTER — Other Ambulatory Visit: Payer: 59

## 2020-11-06 ENCOUNTER — Other Ambulatory Visit: Payer: 59

## 2020-11-09 ENCOUNTER — Encounter: Payer: 59 | Admitting: Internal Medicine

## 2021-04-16 ENCOUNTER — Encounter: Payer: 59 | Admitting: Internal Medicine

## 2021-04-30 ENCOUNTER — Ambulatory Visit (INDEPENDENT_AMBULATORY_CARE_PROVIDER_SITE_OTHER): Payer: 59 | Admitting: Internal Medicine

## 2021-04-30 ENCOUNTER — Other Ambulatory Visit: Payer: Self-pay

## 2021-04-30 ENCOUNTER — Encounter: Payer: Self-pay | Admitting: Internal Medicine

## 2021-04-30 VITALS — BP 126/80 | HR 94 | Temp 98.1°F | Ht 67.0 in | Wt 179.0 lb

## 2021-04-30 DIAGNOSIS — Z1159 Encounter for screening for other viral diseases: Secondary | ICD-10-CM

## 2021-04-30 DIAGNOSIS — F988 Other specified behavioral and emotional disorders with onset usually occurring in childhood and adolescence: Secondary | ICD-10-CM

## 2021-04-30 DIAGNOSIS — Z0000 Encounter for general adult medical examination without abnormal findings: Secondary | ICD-10-CM

## 2021-04-30 DIAGNOSIS — E559 Vitamin D deficiency, unspecified: Secondary | ICD-10-CM

## 2021-04-30 DIAGNOSIS — E7800 Pure hypercholesterolemia, unspecified: Secondary | ICD-10-CM

## 2021-04-30 DIAGNOSIS — E538 Deficiency of other specified B group vitamins: Secondary | ICD-10-CM | POA: Diagnosis not present

## 2021-04-30 DIAGNOSIS — E785 Hyperlipidemia, unspecified: Secondary | ICD-10-CM | POA: Insufficient documentation

## 2021-04-30 NOTE — Assessment & Plan Note (Signed)
Lab Results  Component Value Date   LDLCALC 110 (H) 09/02/2019   Mild uncontrolled, goal ld l< 100, pt to continue current low chol diet

## 2021-04-30 NOTE — Assessment & Plan Note (Signed)
Stable , no current medication needed

## 2021-04-30 NOTE — Progress Notes (Signed)
Patient ID: Clayton Olson, male   DOB: Mar 26, 1981, 40 y.o.   MRN: [id number]         Chief Complaint:: wellness exam       HPI:  Clayton Olson is a 40 y.o. male here for wellness exam; due for hep c screen, declines covid booster and flu shot                        Also gained a few lbs with covid years, but plans to be more active.  Pt denies chest pain, increased sob or doe, wheezing, orthopnea, PND, increased LE swelling, palpitations, dizziness or syncope.   Pt denies polydipsia, polyuria, or new focal neuro s/s. No longer taking addderall and fucitoning well socially and at work.  No new complaints      Wt Readings from Last 3 Encounters:  04/30/21 179 lb (81.2 kg)  09/02/19 178 lb (80.7 kg)  08/22/17 175 lb (79.4 kg)   BP Readings from Last 3 Encounters:  04/30/21 126/80  09/02/19 120/84  08/23/17 128/79   Immunization History  Administered Date(s) Administered   Influenza-Unspecified 04/28/2018   PFIZER(Purple Top)SARS-COV-2 Vaccination 08/04/2020, 08/25/2020   Tdap 04/24/2014   Health Maintenance Due  Topic Date Due   Hepatitis C Screening  Never done      Past Medical History:  Diagnosis Date   ADD (attention deficit disorder)    Allergy    Genital herpes 09/02/2019   Hemorrhoids    Hypertension    Past Surgical History:  Procedure Laterality Date   INGUINAL HERNIA REPAIR     Right    reports that he has quit smoking. His smoking use included cigarettes. He has never used smokeless tobacco. He reports current alcohol use. He reports that he does not use drugs. family history includes Alcohol abuse in an other family member; Arthritis in his brother and mother; Breast cancer in an other family member; Colon cancer in an other family member; Depression in his brother and mother; Diabetes in an other family member; Hearing loss in his father; Heart disease in an other family member; Hypertension in his brother, father, and mother; Kidney disease in an other  family member; Stroke in an other family member. Allergies  Allergen Reactions   Corylus Anaphylaxis   Peanuts [Peanut Oil]     Hazel Nuts   Current Outpatient Medications on File Prior to Visit  Medication Sig Dispense Refill   Dietary Management Product (NEOKE BCAA4 PO) Take by mouth.     fexofenadine (ALLEGRA) 180 MG tablet Take by mouth.     Multiple Vitamin (MULTIVITAMIN) tablet Take 1 tablet by mouth daily.     triamcinolone cream (KENALOG) 0.1 % SMARTSIG:Topical 1-2 Times Daily PRN     valACYclovir (VALTREX) 500 MG tablet Take 2 tablets (1,000 mg total) by mouth 2 (two) times daily. 28 tablet 5   ZYLET 0.5-0.3 % SUSP Place 1 drop into the right eye 3 (three) times daily.     [DISCONTINUED] amphetamine-dextroamphetamine (ADDERALL) 20 MG tablet Take 1 tablet (20 mg total) by mouth 2 (two) times daily. 60 tablet 0   No current facility-administered medications on file prior to visit.        ROS:  All others reviewed and negative.  Objective        PE:  BP 126/80 (BP Location: Right Arm, Patient Position: Sitting, Cuff Size: Normal)   Pulse 94   Temp 98.1 F (36.7 C) (Oral)  Ht 5\' 7"  (1.702 m)   Wt 179 lb (81.2 kg)   SpO2 97%   BMI 28.04 kg/m                 Constitutional: Pt appears in NAD               HENT: Head: NCAT.                Right Ear: External ear normal.                 Left Ear: External ear normal.                Eyes: . Pupils are equal, round, and reactive to light. Conjunctivae and EOM are normal               Nose: without d/c or deformity               Neck: Neck supple. Gross normal ROM               Cardiovascular: Normal rate and regular rhythm.                 Pulmonary/Chest: Effort normal and breath sounds without rales or wheezing.                Abd:  Soft, NT, ND, + BS, no organomegaly               Neurological: Pt is alert. At baseline orientation, motor grossly intact               Skin: Skin is warm. No rashes, no other new lesions,  LE edema -  none               Psychiatric: Pt behavior is normal without agitation   Micro: none  Cardiac tracings I have personally interpreted today:  none  Pertinent Radiological findings (summarize): none   Lab Results  Component Value Date   WBC 5.0 09/02/2019   HGB 16.4 09/02/2019   HCT 47.8 09/02/2019   PLT 209.0 09/02/2019   GLUCOSE 92 09/02/2019   CHOL 186 09/02/2019   TRIG 74.0 09/02/2019   HDL 61.90 09/02/2019   LDLCALC 110 (H) 09/02/2019   ALT 31 09/02/2019   AST 20 09/02/2019   NA 138 09/02/2019   K 4.4 09/02/2019   CL 104 09/02/2019   CREATININE 1.04 09/02/2019   BUN 16 09/02/2019   CO2 29 09/02/2019   TSH 1.48 09/02/2019   Assessment/Plan:  Clayton Olson is a 40 y.o. White or Caucasian [1] male with  has a past medical history of ADD (attention deficit disorder), Allergy, Genital herpes (09/02/2019), Hemorrhoids, and Hypertension.  HLD (hyperlipidemia) Lab Results  Component Value Date   LDLCALC 110 (H) 09/02/2019   Mild uncontrolled, goal ld l< 100, pt to continue current low chol diet   Preventative health care Age and sex appropriate education and counseling updated with regular exercise and diet Referrals for preventative services - for hep c screen Immunizations addressed - declines covid booster and flu shot Smoking counseling  - none needed Evidence for depression or other mood disorder - none significant Most recent labs reviewed. I have personally reviewed and have noted: 1) the patient's medical and social history 2) The patient's current medications and supplements 3) The patient's height, weight, and BMI have been recorded in the chart  Attention deficit disorder Stable , no current medication needed  Followup:  Return in about 2 years (around 05/01/2023).  Cathlean Cower, MD 04/30/2021 7:52 PM [facility]

## 2021-04-30 NOTE — Assessment & Plan Note (Signed)
Age and sex appropriate education and counseling updated with regular exercise and diet Referrals for preventative services - for hep c screen Immunizations addressed - declines covid booster and flu shot Smoking counseling  - none needed Evidence for depression or other mood disorder - none significant Most recent labs reviewed. I have personally reviewed and have noted: 1) the patient's medical and social history 2) The patient's current medications and supplements 3) The patient's height, weight, and BMI have been recorded in the chart

## 2021-04-30 NOTE — Patient Instructions (Signed)
Please continue all other medications as before, and refills have been done if requested.  Please have the pharmacy call with any other refills you may need.  Please continue your efforts at being more active, low cholesterol diet, and weight control.  You are otherwise up to date with prevention measures today.  Please keep your appointments with your specialists as you may have planned  Please go to the LAB at the blood drawing area for the tests to be done at the [facility] in the baement lab - no appointment is needed  You will be contacted by phone if any changes need to be made immediately.  Otherwise, you will receive a letter about your results with an explanation, but please check with MyChart first.  Please remember to sign up for MyChart if you have not done so, as this will be important to you in the future with finding out test results, communicating by private email, and scheduling acute appointments online when needed.  Please make an Appointment to return in 2 years, or sooner if neeed

## 2021-05-28 ENCOUNTER — Encounter: Payer: Self-pay | Admitting: Internal Medicine

## 2021-05-28 MED ORDER — VALACYCLOVIR HCL 500 MG PO TABS: 1000.0000 mg | ORAL_TABLET | Freq: Two times a day (BID) | ORAL | 5 refills | Status: DC

## 2022-04-11 ENCOUNTER — Ambulatory Visit (INDEPENDENT_AMBULATORY_CARE_PROVIDER_SITE_OTHER): Payer: PRIVATE HEALTH INSURANCE | Admitting: Internal Medicine

## 2022-04-11 VITALS — BP 132/80 | HR 104 | Temp 97.7°F | Ht 68.19 in | Wt 169.0 lb

## 2022-04-11 DIAGNOSIS — E7800 Pure hypercholesterolemia, unspecified: Secondary | ICD-10-CM | POA: Diagnosis not present

## 2022-04-11 DIAGNOSIS — Z0001 Encounter for general adult medical examination with abnormal findings: Secondary | ICD-10-CM

## 2022-04-11 DIAGNOSIS — F988 Other specified behavioral and emotional disorders with onset usually occurring in childhood and adolescence: Secondary | ICD-10-CM | POA: Diagnosis not present

## 2022-04-11 DIAGNOSIS — J309 Allergic rhinitis, unspecified: Secondary | ICD-10-CM

## 2022-04-11 DIAGNOSIS — Z0000 Encounter for general adult medical examination without abnormal findings: Secondary | ICD-10-CM

## 2022-04-11 MED ORDER — AMPHETAMINE-DEXTROAMPHETAMINE 20 MG PO TABS: 20.0000 mg | ORAL_TABLET | Freq: Two times a day (BID) | ORAL | 0 refills | Status: DC

## 2022-04-11 NOTE — Patient Instructions (Signed)
Please take all new medication as prescribed - the adderall  Please let us know by Mychart when you need refills  Please continue all other medications as before, and refills have been done if requested.  Please have the pharmacy call with any other refills you may need.  Please continue your efforts at being more active, low cholesterol diet, and weight control.  You are otherwise up to date with prevention measures today.  Please keep your appointments with your specialists as you may have planned  Please go to the LAB at the blood drawing area for the tests to be done  You will be contacted by phone if any changes need to be made immediately.  Otherwise, you will receive a letter about your results with an explanation, but please check with MyChart first.  Please make an Appointment to return for your 1 year visit, or sooner if needed

## 2022-04-11 NOTE — Progress Notes (Unsigned)
Patient ID: Clayton Olson, male   DOB: 07/28/1981, 41 y.o.   MRN: [id number]         Chief Complaint:: wellness exam and concentration issues  ***       HPI:  Clayton Olson is a 41 y.o. male here for wellness exam                        Also***   Wt Readings from Last 3 Encounters:  04/11/22 169 lb (76.7 kg)  04/30/21 179 lb (81.2 kg)  09/02/19 178 lb (80.7 kg)   BP Readings from Last 3 Encounters:  04/11/22 132/80  04/30/21 126/80  09/02/19 120/84   Immunization History  Administered Date(s) Administered   Influenza-Unspecified 04/28/2018   PFIZER(Purple Top)SARS-COV-2 Vaccination 08/04/2020, 08/25/2020   Tdap 04/24/2014   Health Maintenance Due  Topic Date Due   Hepatitis C Screening  Never done      Past Medical History:  Diagnosis Date   ADD (attention deficit disorder)    Allergy    Genital herpes 09/02/2019   Hemorrhoids    Hypertension    Past Surgical History:  Procedure Laterality Date   INGUINAL HERNIA REPAIR     Right    reports that he has quit smoking. His smoking use included cigarettes. He has never used smokeless tobacco. He reports current alcohol use. He reports that he does not use drugs. family history includes Alcohol abuse in an other family member; Arthritis in his brother and mother; Breast cancer in an other family member; Colon cancer in an other family member; Depression in his brother and mother; Diabetes in an other family member; Hearing loss in his father; Heart disease in an other family member; Hypertension in his brother, father, and mother; Kidney disease in an other family member; Stroke in an other family member. Allergies  Allergen Reactions   Corylus Anaphylaxis   Peanuts [Peanut Oil]     Hazel Nuts   Current Outpatient Medications on File Prior to Visit  Medication Sig Dispense Refill   Dietary Management Product (NEOKE BCAA4 PO) Take by mouth.     fexofenadine (ALLEGRA) 180 MG tablet Take by mouth.     Multiple  Vitamin (MULTIVITAMIN) tablet Take 1 tablet by mouth daily.     triamcinolone cream (KENALOG) 0.1 % SMARTSIG:Topical 1-2 Times Daily PRN     valACYclovir (VALTREX) 500 MG tablet Take 2 tablets (1,000 mg total) by mouth 2 (two) times daily. 28 tablet 5   ZYLET 0.5-0.3 % SUSP Place 1 drop into the right eye 3 (three) times daily.     [DISCONTINUED] amphetamine-dextroamphetamine (ADDERALL) 20 MG tablet Take 1 tablet (20 mg total) by mouth 2 (two) times daily. 60 tablet 0   No current facility-administered medications on file prior to visit.        ROS:  All others reviewed and negative.  Objective        PE:  BP 132/80 (BP Location: Right Arm, Patient Position: Sitting, Cuff Size: Large)   Pulse (!) 104   Temp 97.7 F (36.5 C) (Oral)   Ht 5' 8.19" (1.732 m)   Wt 169 lb (76.7 kg)   SpO2 98%   BMI 25.55 kg/m                 Constitutional: Pt appears in NAD               HENT: Head: NCAT.  Right Ear: External ear normal.                 Left Ear: External ear normal.                Eyes: . Pupils are equal, round, and reactive to light. Conjunctivae and EOM are normal               Nose: without d/c or deformity               Neck: Neck supple. Gross normal ROM               Cardiovascular: Normal rate and regular rhythm.                 Pulmonary/Chest: Effort normal and breath sounds without rales or wheezing.                Abd:  Soft, NT, ND, + BS, no organomegaly               Neurological: Pt is alert. At baseline orientation, motor grossly intact               Skin: Skin is warm. No rashes, no other new lesions, LE edema - ***               Psychiatric: Pt behavior is normal without agitation   Micro: none  Cardiac tracings I have personally interpreted today:  none  Pertinent Radiological findings (summarize): none   Lab Results  Component Value Date   WBC 5.0 09/02/2019   HGB 16.4 09/02/2019   HCT 47.8 09/02/2019   PLT 209.0 09/02/2019   GLUCOSE 92  09/02/2019   CHOL 186 09/02/2019   TRIG 74.0 09/02/2019   HDL 61.90 09/02/2019   LDLCALC 110 (H) 09/02/2019   ALT 31 09/02/2019   AST 20 09/02/2019   NA 138 09/02/2019   K 4.4 09/02/2019   CL 104 09/02/2019   CREATININE 1.04 09/02/2019   BUN 16 09/02/2019   CO2 29 09/02/2019   TSH 1.48 09/02/2019   Assessment/Plan:  Clayton Olson is a 41 y.o. White or Caucasian [1] male with  has a past medical history of ADD (attention deficit disorder), Allergy, Genital herpes (09/02/2019), Hemorrhoids, and Hypertension.  No problem-specific Assessment & Plan notes found for this encounter.  Followup: No follow-ups on file.  Clayton Cower, MD 04/11/2022 4:13 PM [facility]

## 2022-04-12 ENCOUNTER — Encounter: Payer: Self-pay | Admitting: Internal Medicine

## 2022-04-14 ENCOUNTER — Encounter: Payer: Self-pay | Admitting: Internal Medicine

## 2022-04-14 NOTE — Assessment & Plan Note (Signed)
Worsening symtpomatic, for adderall 20 bid

## 2022-04-14 NOTE — Assessment & Plan Note (Signed)
Mild, stable, cont otc preps prn such as allegra qd prn 180  mg

## 2022-04-14 NOTE — Assessment & Plan Note (Signed)
Age and sex appropriate education and counseling updated with regular exercise and diet Referrals for preventative services - for hep c screen Immunizations addressed - to have flu shot at work Smoking counseling  - none needed Evidence for depression or other mood disorder - chronic anxiety no change, stable, tolerable Most recent labs reviewed. I have personally reviewed and have noted: 1) the patient's medical and social history 2) The patient's current medications and supplements 3) The patient's height, weight, and BMI have been recorded in the chart

## 2022-04-14 NOTE — Assessment & Plan Note (Signed)
Lab Results  Component Value Date   LDLCALC 110 (H) 09/02/2019   Uncontrolled, pt to continue current low chol diet, declines statin

## 2022-04-16 ENCOUNTER — Telehealth: Payer: Self-pay

## 2022-04-16 NOTE — Telephone Encounter (Signed)
Pt was advised that he would need a PA for amphetamine-dextroamphetamine 20 MG tablet  Please advise

## 2022-04-17 NOTE — Telephone Encounter (Signed)
Patient is calling in stating that the PA needs to go to [facility].

## 2022-04-18 NOTE — Telephone Encounter (Signed)
Communicated and updated patient in response to his mychart message.

## 2022-05-01 NOTE — Telephone Encounter (Signed)
Pt is requesting a call back with and update on the status of the PA.  Pt CB [phone]

## 2022-05-13 ENCOUNTER — Telehealth: Payer: Self-pay

## 2022-05-13 NOTE — Telephone Encounter (Signed)
ERROR

## 2022-12-12 ENCOUNTER — Encounter: Payer: Self-pay | Admitting: Internal Medicine

## 2022-12-12 ENCOUNTER — Ambulatory Visit: Payer: PRIVATE HEALTH INSURANCE | Admitting: Internal Medicine

## 2022-12-12 VITALS — BP 120/74 | HR 85 | Temp 98.1°F | Ht 68.0 in | Wt 175.0 lb

## 2022-12-12 DIAGNOSIS — R739 Hyperglycemia, unspecified: Secondary | ICD-10-CM | POA: Diagnosis not present

## 2022-12-12 DIAGNOSIS — Z0001 Encounter for general adult medical examination with abnormal findings: Secondary | ICD-10-CM | POA: Diagnosis not present

## 2022-12-12 DIAGNOSIS — E559 Vitamin D deficiency, unspecified: Secondary | ICD-10-CM

## 2022-12-12 DIAGNOSIS — N529 Male erectile dysfunction, unspecified: Secondary | ICD-10-CM

## 2022-12-12 DIAGNOSIS — E7800 Pure hypercholesterolemia, unspecified: Secondary | ICD-10-CM

## 2022-12-12 DIAGNOSIS — A6000 Herpesviral infection of urogenital system, unspecified: Secondary | ICD-10-CM | POA: Diagnosis not present

## 2022-12-12 DIAGNOSIS — E538 Deficiency of other specified B group vitamins: Secondary | ICD-10-CM | POA: Diagnosis not present

## 2022-12-12 DIAGNOSIS — F988 Other specified behavioral and emotional disorders with onset usually occurring in childhood and adolescence: Secondary | ICD-10-CM

## 2022-12-12 DIAGNOSIS — Z125 Encounter for screening for malignant neoplasm of prostate: Secondary | ICD-10-CM

## 2022-12-12 DIAGNOSIS — Z1159 Encounter for screening for other viral diseases: Secondary | ICD-10-CM

## 2022-12-12 LAB — LIPID PANEL
Cholesterol: 157 mg/dL (ref 0–200)
HDL: 60.8 mg/dL (ref >39.00)
LDL Cholesterol: 77 mg/dL (ref 0–99)
NonHDL: 96.24
Total CHOL/HDL Ratio: 3
Triglycerides: 98 mg/dL (ref 0.0–149.0)
VLDL: 19.6 mg/dL (ref 0.0–40.0)

## 2022-12-12 LAB — BASIC METABOLIC PANEL
BUN: 12 mg/dL (ref 6–23)
CO2: 30 mEq/L (ref 19–32)
Calcium: 9.6 mg/dL (ref 8.4–10.5)
Chloride: 103 mEq/L (ref 96–112)
Creatinine, Ser: 1.03 mg/dL (ref 0.40–1.50)
GFR: 90.86 mL/min (ref >60.00)
Glucose, Bld: 85 mg/dL (ref 70–99)
Potassium: 4.1 mEq/L (ref 3.5–5.1)
Sodium: 139 mEq/L (ref 135–145)

## 2022-12-12 LAB — CBC WITH DIFFERENTIAL/PLATELET
Basophils Absolute: 0.1 10*3/uL (ref 0.0–0.1)
Basophils Relative: 2.1 % (ref 0.0–3.0)
Eosinophils Absolute: 0.5 10*3/uL (ref 0.0–0.7)
Eosinophils Relative: 11 % — ABNORMAL HIGH (ref 0.0–5.0)
HCT: 46.8 % (ref 39.0–52.0)
Hemoglobin: 16.4 g/dL (ref 13.0–17.0)
Lymphocytes Relative: 32.6 % (ref 12.0–46.0)
Lymphs Abs: 1.5 10*3/uL (ref 0.7–4.0)
MCHC: 34.9 g/dL (ref 30.0–36.0)
MCV: 84 fl (ref 78.0–100.0)
Monocytes Absolute: 0.5 10*3/uL (ref 0.1–1.0)
Monocytes Relative: 10.6 % (ref 3.0–12.0)
Neutro Abs: 2 10*3/uL (ref 1.4–7.7)
Neutrophils Relative %: 43.7 % (ref 43.0–77.0)
Platelets: 211 10*3/uL (ref 150.0–400.0)
RBC: 5.58 Mil/uL (ref 4.22–5.81)
RDW: 13.2 % (ref 11.5–15.5)
WBC: 4.7 10*3/uL (ref 4.0–10.5)

## 2022-12-12 LAB — HEPATIC FUNCTION PANEL
ALT: 19 U/L (ref 0–53)
AST: 18 U/L (ref 0–37)
Albumin: 4.5 g/dL (ref 3.5–5.2)
Alkaline Phosphatase: 48 U/L (ref 39–117)
Bilirubin, Direct: 0.1 mg/dL (ref 0.0–0.3)
Total Bilirubin: 0.7 mg/dL (ref 0.2–1.2)
Total Protein: 7.1 g/dL (ref 6.0–8.3)

## 2022-12-12 LAB — URINALYSIS, ROUTINE W REFLEX MICROSCOPIC
Bilirubin Urine: NEGATIVE
Hgb urine dipstick: NEGATIVE
Ketones, ur: NEGATIVE
Leukocytes,Ua: NEGATIVE
Nitrite: NEGATIVE
RBC / HPF: NONE SEEN (ref 0–?)
Specific Gravity, Urine: 1.01 (ref 1.000–1.030)
Total Protein, Urine: NEGATIVE
Urine Glucose: NEGATIVE
Urobilinogen, UA: 0.2 (ref 0.0–1.0)
WBC, UA: NONE SEEN (ref 0–?)
pH: 7 (ref 5.0–8.0)

## 2022-12-12 LAB — PSA: PSA: 1.45 ng/mL (ref 0.10–4.00)

## 2022-12-12 LAB — TESTOSTERONE: Testosterone: 458.42 ng/dL (ref 300.00–890.00)

## 2022-12-12 LAB — TSH: TSH: 2.02 u[IU]/mL (ref 0.35–5.50)

## 2022-12-12 LAB — VITAMIN D 25 HYDROXY (VIT D DEFICIENCY, FRACTURES): VITD: 25.33 ng/mL — ABNORMAL LOW (ref 30.00–100.00)

## 2022-12-12 LAB — VITAMIN B12: Vitamin B-12: 273 pg/mL (ref 211–911)

## 2022-12-12 LAB — HEMOGLOBIN A1C: Hgb A1c MFr Bld: 5.1 % (ref 4.6–6.5)

## 2022-12-12 MED ORDER — AMPHETAMINE-DEXTROAMPHETAMINE 10 MG PO TABS: 10.0000 mg | ORAL_TABLET | Freq: Two times a day (BID) | ORAL | 0 refills | Status: DC

## 2022-12-12 MED ORDER — VALACYCLOVIR HCL 500 MG PO TABS: 1000.0000 mg | ORAL_TABLET | Freq: Two times a day (BID) | ORAL | 5 refills | Status: DC

## 2022-12-12 MED ORDER — SILDENAFIL CITRATE 100 MG PO TABS: 50.0000 mg | ORAL_TABLET | Freq: Every day | ORAL | 11 refills | Status: AC | PRN

## 2022-12-12 NOTE — Patient Instructions (Signed)
Ok to decrease the adderrall to 10 mg twice per day  Please take all new medication as prescribed -the viagra as needed  Please continue all other medications as before, and refills have been done if requested - the valtrex  Please have the pharmacy call with any other refills you may need.  Please continue your efforts at being more active, low cholesterol diet, and weight control.  You are otherwise up to date with prevention measures today.  Please keep your appointments with your specialists as you may have planned  Please go to the LAB at the blood drawing area for the tests to be done  You will be contacted by phone if any changes need to be made immediately.  Otherwise, you will receive a letter about your results with an explanation, but please check with MyChart first.  Please remember to sign up for MyChart if you have not done so, as this will be important to you in the future with finding out test results, communicating by private email, and scheduling acute appointments online when needed.  Please make an Appointment to return for your 1 year visit, or sooner if needed

## 2022-12-12 NOTE — Progress Notes (Signed)
The test results show that your current treatment is OK, as the tests are stable, except the Vitamin D level is low.  Please take OTC Vitamin D3 at 2000 units per day, indefinitely,  Otherwise,.  Please continue the same plan.  There is no other need for change of treatment or further evaluation based on these results, at this time.  thanks

## 2022-12-12 NOTE — Progress Notes (Signed)
Patient ID: Clayton Olson, male   DOB: 1981-07-07, 42 y.o.   MRN: [id number]         Chief Complaint:: wellness exam and ED, ADD, hx of genital herpes, hld       HPI:  Clayton Olson is a 42 y.o. male here for wellness exam; up to date                Also has been taking half adderall and less irritable less maritral conflict.  Needs valtrex refill for recurring episodes genital herpes.  Adderall 20 mg bid now seems too much with too activating, jittery.  Has had mild worsening Ed symptoms in the past 6 months, asks for assist with PDE5, though may be psychological.  Pt denies chest pain, increased sob or doe, wheezing, orthopnea, PND, increased LE swelling, palpitations, dizziness or syncope.   Pt denies polydipsia, polyuria, or new focal neuro s/s.   Pt denies fever, wt loss, night sweats, loss of appetite, or other constitutional symptoms   Wt Readings from Last 3 Encounters:  12/12/22 175 lb (79.4 kg)  04/11/22 169 lb (76.7 kg)  04/30/21 179 lb (81.2 kg)   BP Readings from Last 3 Encounters:  12/12/22 120/74  04/11/22 132/80  04/30/21 126/80   Immunization History  Administered Date(s) Administered   Influenza-Unspecified 04/28/2018   PFIZER(Purple Top)SARS-COV-2 Vaccination 08/04/2020, 08/25/2020   Tdap 04/24/2014   There are no preventive care reminders to display for this patient.     Past Medical History:  Diagnosis Date   ADD (attention deficit disorder)    Allergy    Genital herpes 09/02/2019   Hemorrhoids    Hypertension    Past Surgical History:  Procedure Laterality Date   INGUINAL HERNIA REPAIR     Right    reports that he has quit smoking. His smoking use included cigarettes. He has never used smokeless tobacco. He reports current alcohol use. He reports that he does not use drugs. family history includes Alcohol abuse in an other family member; Arthritis in his brother and mother; Breast cancer in an other family member; Colon cancer in an other family  member; Depression in his brother and mother; Diabetes in an other family member; Hearing loss in his father; Heart disease in an other family member; Hypertension in his brother, father, and mother; Kidney disease in an other family member; Stroke in an other family member. Allergies  Allergen Reactions   Corylus Anaphylaxis   Peanuts [Peanut Oil]     Hazel Nuts   Current Outpatient Medications on File Prior to Visit  Medication Sig Dispense Refill   Dietary Management Product (NEOKE BCAA4 PO) Take by mouth.     fexofenadine (ALLEGRA) 180 MG tablet Take by mouth.     Multiple Vitamin (MULTIVITAMIN) tablet Take 1 tablet by mouth daily.     triamcinolone cream (KENALOG) 0.1 % SMARTSIG:Topical 1-2 Times Daily PRN     ZYLET 0.5-0.3 % SUSP Place 1 drop into the right eye 3 (three) times daily.     No current facility-administered medications on file prior to visit.        ROS:  All others reviewed and negative.  Objective        PE:  BP 120/74 (BP Location: Right Arm, Patient Position: Sitting, Cuff Size: Normal)   Pulse 85   Temp 98.1 F (36.7 C) (Oral)   Ht 5\' 8"  (1.727 m)   Wt 175 lb (79.4 kg)   SpO2 99%  BMI 26.61 kg/m                 Constitutional: Pt appears in NAD               HENT: Head: NCAT.                Right Ear: External ear normal.                 Left Ear: External ear normal.                Eyes: . Pupils are equal, round, and reactive to light. Conjunctivae and EOM are normal               Nose: without d/c or deformity               Neck: Neck supple. Gross normal ROM               Cardiovascular: Normal rate and regular rhythm.                 Pulmonary/Chest: Effort normal and breath sounds without rales or wheezing.                Abd:  Soft, NT, ND, + BS, no organomegaly               Neurological: Pt is alert. At baseline orientation, motor grossly intact               Skin: Skin is warm. No rashes, no other new lesions, LE edema - none                Psychiatric: Pt behavior is normal without agitation   Micro: none  Cardiac tracings I have personally interpreted today:  none  Pertinent Radiological findings (summarize): none   Lab Results  Component Value Date   WBC 4.7 12/12/2022   HGB 16.4 12/12/2022   HCT 46.8 12/12/2022   PLT 211.0 12/12/2022   GLUCOSE 85 12/12/2022   CHOL 157 12/12/2022   TRIG 98.0 12/12/2022   HDL 60.80 12/12/2022   LDLCALC 77 12/12/2022   ALT 19 12/12/2022   AST 18 12/12/2022   NA 139 12/12/2022   K 4.1 12/12/2022   CL 103 12/12/2022   CREATININE 1.03 12/12/2022   BUN 12 12/12/2022   CO2 30 12/12/2022   TSH 2.02 12/12/2022   PSA 1.45 12/12/2022   HGBA1C 5.1 12/12/2022   Assessment/Plan:  Clayton Olson is a 42 y.o. White or Caucasian [1] male with  has a past medical history of ADD (attention deficit disorder), Allergy, Genital herpes (09/02/2019), Hemorrhoids, and Hypertension.  Encounter for well adult exam with abnormal findings Age and sex appropriate education and counseling updated with regular exercise and diet Referrals for preventative services - none needed Immunizations addressed - none needed Smoking counseling  - none needed Evidence for depression or other mood disorder - none significant Most recent labs reviewed. I have personally reviewed and have noted: 1) the patient's medical and social history 2) The patient's current medications and supplements 3) The patient's height, weight, and BMI have been recorded in the chart   Erectile dysfunction With recent onset mild worsening, for viagra prn, consider marital counseling, for testosterone level  Attention deficit disorder Ok for reduced adderall 10 mg bid and follow  HLD (hyperlipidemia) Lab Results  Component Value Date   LDLCALC 77 12/12/2022   Stable, pt to cont lower chol diet,  declines statin for now   Genital herpes Ok for refill prn valtrex for recurrent episodes  Followup: Return in about 1 year  (around 12/12/2023).  Clayton Barre, MD 12/15/2022 1:57 PM [facility]

## 2022-12-13 LAB — HEPATITIS C ANTIBODY: Hepatitis C Ab: NONREACTIVE

## 2022-12-15 ENCOUNTER — Encounter: Payer: Self-pay | Admitting: Internal Medicine

## 2022-12-15 NOTE — Assessment & Plan Note (Addendum)
With recent onset mild worsening, for viagra prn, consider marital counseling, for testosterone level

## 2022-12-15 NOTE — Assessment & Plan Note (Signed)
Ok for reduced adderall 10 mg bid and follow

## 2022-12-15 NOTE — Assessment & Plan Note (Signed)
Ok for refill prn valtrex for recurrent episodes

## 2022-12-15 NOTE — Assessment & Plan Note (Signed)
Lab Results  Component Value Date   LDLCALC 77 12/12/2022   Stable, pt to cont lower chol diet, declines statin for now

## 2022-12-15 NOTE — Assessment & Plan Note (Signed)

## 2022-12-16 ENCOUNTER — Encounter: Payer: Self-pay | Admitting: Internal Medicine

## 2022-12-16 ENCOUNTER — Ambulatory Visit: Payer: PRIVATE HEALTH INSURANCE | Admitting: Internal Medicine

## 2023-01-15 MED ORDER — AMPHETAMINE-DEXTROAMPHETAMINE 10 MG PO TABS: 10.0000 mg | ORAL_TABLET | Freq: Two times a day (BID) | ORAL | 0 refills | Status: DC

## 2023-01-15 NOTE — Telephone Encounter (Signed)
Ok this is done erx 

## 2023-02-24 ENCOUNTER — Other Ambulatory Visit: Payer: Self-pay | Admitting: Internal Medicine

## 2023-02-24 MED ORDER — AMPHETAMINE-DEXTROAMPHETAMINE 10 MG PO TABS: 10.0000 mg | ORAL_TABLET | Freq: Two times a day (BID) | ORAL | 0 refills | Status: DC

## 2023-04-11 ENCOUNTER — Other Ambulatory Visit: Payer: Self-pay | Admitting: Internal Medicine

## 2023-04-11 ENCOUNTER — Encounter: Payer: Self-pay | Admitting: Internal Medicine

## 2023-04-11 MED ORDER — AMPHETAMINE-DEXTROAMPHETAMINE 10 MG PO TABS: 10.0000 mg | ORAL_TABLET | Freq: Two times a day (BID) | ORAL | 0 refills | Status: DC

## 2023-04-14 MED ORDER — AMPHETAMINE-DEXTROAMPHETAMINE 20 MG PO TABS: 20.0000 mg | ORAL_TABLET | Freq: Two times a day (BID) | ORAL | 0 refills | Status: DC

## 2023-06-14 ENCOUNTER — Other Ambulatory Visit: Payer: Self-pay | Admitting: Internal Medicine

## 2023-06-16 MED ORDER — AMPHETAMINE-DEXTROAMPHETAMINE 20 MG PO TABS: 20.0000 mg | ORAL_TABLET | Freq: Two times a day (BID) | ORAL | 0 refills | Status: DC

## 2023-06-17 ENCOUNTER — Encounter: Payer: Self-pay | Admitting: Internal Medicine

## 2023-06-19 ENCOUNTER — Telehealth: Payer: Self-pay

## 2023-06-19 ENCOUNTER — Other Ambulatory Visit (HOSPITAL_COMMUNITY): Payer: Self-pay

## 2023-06-19 ENCOUNTER — Other Ambulatory Visit: Payer: Self-pay | Admitting: Internal Medicine

## 2023-06-19 ENCOUNTER — Telehealth: Payer: Self-pay | Admitting: Internal Medicine

## 2023-06-19 MED ORDER — AMPHETAMINE-DEXTROAMPHETAMINE 20 MG PO TABS: 20.0000 mg | ORAL_TABLET | Freq: Two times a day (BID) | ORAL | 0 refills | Status: DC

## 2023-06-19 NOTE — Telephone Encounter (Signed)
Pharmacy Patient Advocate Encounter   Received notification from Patient Advice Request messages that prior authorization for Amphetamine-Dextroamphetamine 20MG  tablets is required/requested.   Insurance verification completed.   The patient is insured through [facility] .   Per test claim: PA required; PA submitted to above mentioned insurance via CoverMyMeds Key/confirmation #/EOC [id number] Status is pending

## 2023-06-19 NOTE — Telephone Encounter (Signed)
The pharmacy is wanting prior authorization on the Adderal 20 mg.

## 2023-06-20 ENCOUNTER — Other Ambulatory Visit (HOSPITAL_COMMUNITY): Payer: Self-pay

## 2023-06-20 NOTE — Telephone Encounter (Signed)
Pharmacy Patient Advocate Encounter  Received notification from [facility] that Prior Authorization for Amphetamine-Dextroamphetamine 20MG  tablets  has been APPROVED from 06/19/23 to 06/18/24   PA #/Case ID/Reference #: [id number]

## 2023-10-27 ENCOUNTER — Other Ambulatory Visit: Payer: Self-pay | Admitting: Internal Medicine

## 2023-10-27 NOTE — Telephone Encounter (Signed)
 Pt needs to sch OV w/Dr Jonny Ruiz

## 2023-10-30 ENCOUNTER — Other Ambulatory Visit: Payer: Self-pay | Admitting: Internal Medicine

## 2023-10-30 MED ORDER — AMPHETAMINE-DEXTROAMPHETAMINE 20 MG PO TABS: 20.0000 mg | ORAL_TABLET | Freq: Two times a day (BID) | ORAL | 0 refills | Status: DC

## 2023-10-30 NOTE — Telephone Encounter (Unsigned)
 Copied from CRM [id number]. Topic: Clinical - Medication Refill >> Oct 30, 2023 10:18 AM Randa Ngo wrote: Most Recent Primary Care Visit:  Provider: Corwin Levins  Department: [facility]  Visit Type: OFFICE VISIT  Date: 12/12/2022  Medication: amphetamine-dextroamphetamine (ADDERALL) 20 MG tablet  Has the patient contacted their pharmacy? No, patient tried refilling over MyChart, but it would not let him. (Agent: If no, request that the patient contact the pharmacy for the refill. If patient does not wish to contact the pharmacy document the reason why and proceed with request.) (Agent: If yes, when and what did the pharmacy advise?)  Is this the correct pharmacy for this prescription? Yes If no, delete pharmacy and type the correct one.  This is the patient's preferred pharmacy:    [facility] [phone] [place], [place] [phone] AT [facility] OF [address] [place] [address] Phone: [phone] Fax: [phone]   Has the prescription been filled recently? No  Is the patient out of the medication? Yes  Has the patient been seen for an appointment in the last year OR does the patient have an upcoming appointment? Yes  Can we respond through MyChart? Yes  Agent: Please be advised that Rx refills may take up to 3 business days. We ask that you follow-up with your pharmacy. >> Oct 30, 2023  4:25 PM Arm[place] J wrote: Medication was sent to incorrect pharmacy. Medication was sent to [facility] in [place], patient needs this sent to [facility] on [address] [place] [address]

## 2023-10-30 NOTE — Telephone Encounter (Signed)
 Copied from CRM [id number]. Topic: Clinical - Medication Refill >> Oct 30, 2023 10:18 AM Randa Ngo wrote: Most Recent Primary Care Visit:  Provider: Corwin Levins  Department: [facility]  Visit Type: OFFICE VISIT  Date: 12/12/2022  Medication: amphetamine-dextroamphetamine (ADDERALL) 20 MG tablet  Has the patient contacted their pharmacy? No, patient tried refilling over MyChart, but it would not let him. (Agent: If no, request that the patient contact the pharmacy for the refill. If patient does not wish to contact the pharmacy document the reason why and proceed with request.) (Agent: If yes, when and what did the pharmacy advise?)  Is this the correct pharmacy for this prescription? Yes If no, delete pharmacy and type the correct one.  This is the patient's preferred pharmacy:    [facility] [phone] [place], [place] [phone] [address] AT [facility] OF [address] [place] [address] Phone: [phone] Fax: [phone]   Has the prescription been filled recently? No  Is the patient out of the medication? Yes  Has the patient been seen for an appointment in the last year OR does the patient have an upcoming appointment? Yes  Can we respond through MyChart? Yes  Agent: Please be advised that Rx refills may take up to 3 business days. We ask that you follow-up with your pharmacy.

## 2023-10-31 MED ORDER — AMPHETAMINE-DEXTROAMPHETAMINE 20 MG PO TABS: 20.0000 mg | ORAL_TABLET | Freq: Two times a day (BID) | ORAL | 0 refills | Status: DC

## 2023-12-10 ENCOUNTER — Other Ambulatory Visit: Payer: Self-pay | Admitting: Internal Medicine

## 2023-12-10 ENCOUNTER — Telehealth: Payer: Self-pay | Admitting: Internal Medicine

## 2023-12-10 NOTE — Telephone Encounter (Unsigned)
 Copied from CRM [id number]. Topic: Clinical - Medication Refill >> Dec 10, 2023  3:42 PM Caliyah H wrote: Medication: (ADDERALL)  Has the patient contacted their pharmacy? Yes Patient was informed to contact his PCP regarding rx refill request.  (Agent: If no, request that the patient contact the pharmacy for the refill. If patient does not wish to contact the pharmacy document the reason why and proceed with request.) (Agent: If yes, when and what did the pharmacy advise?)  This is the patient's preferred pharmacy:  [facility] [phone] [place], [place] - [phone] AT [facility] OF [address] [place] [address] Phone: [phone] Fax: [phone]  Is this the correct pharmacy for this prescription? Yes If no, delete pharmacy and type the correct one.   Has the prescription been filled recently? Yes  Is the patient out of the medication? Yes  Has the patient been seen for an appointment in the last year OR does the patient have an upcoming appointment? No  Can we respond through MyChart? Yes  Agent: Please be advised that Rx refills may take up to 3 business days. We ask that you follow-up with your pharmacy.

## 2023-12-10 NOTE — Telephone Encounter (Signed)
 Last Fill: 10/31/23 60 tabs/0 RF  Last OV: 12/12/22 Next OV: 12/24/23  Routing to provider for review/authorization.   Copied from CRM [id number]. Topic: Clinical - Medication Refill >> Dec 10, 2023  3:42 PM Caliyah H wrote: Medication: (ADDERALL)  Has the patient contacted their pharmacy? Yes Patient was informed to contact his PCP regarding rx refill request.  (Agent: If no, request that the patient contact the pharmacy for the refill. If patient does not wish to contact the pharmacy document the reason why and proceed with request.) (Agent: If yes, when and what did the pharmacy advise?)  This is the patient's preferred pharmacy:  [facility] [phone] - [place], [place] - [address] AT [facility] OF [address] [place] [address] Phone: [phone] Fax: [phone]  Is this the correct pharmacy for this prescription? Yes If no, delete pharmacy and type the correct one.   Has the prescription been filled recently? Yes  Is the patient out of the medication? Yes  Has the patient been seen for an appointment in the last year OR does the patient have an upcoming appointment? No  Can we respond through MyChart? Yes  Agent: Please be advised that Rx refills may take up to 3 business days. We ask that you follow-up with your pharmacy.

## 2023-12-11 MED ORDER — AMPHETAMINE-DEXTROAMPHETAMINE 20 MG PO TABS: 20.0000 mg | ORAL_TABLET | Freq: Two times a day (BID) | ORAL | 0 refills | Status: DC

## 2023-12-24 ENCOUNTER — Ambulatory Visit: Payer: PRIVATE HEALTH INSURANCE | Admitting: Internal Medicine

## 2024-01-08 ENCOUNTER — Other Ambulatory Visit: Payer: Self-pay

## 2024-01-08 ENCOUNTER — Other Ambulatory Visit: Payer: Self-pay | Admitting: Internal Medicine

## 2024-01-08 MED ORDER — VALACYCLOVIR HCL 500 MG PO TABS: 1000.0000 mg | ORAL_TABLET | Freq: Two times a day (BID) | ORAL | 5 refills | Status: AC

## 2024-01-08 NOTE — Telephone Encounter (Unsigned)
 Copied from CRM [id number]. Topic: Clinical - Medication Refill >> Jan 08, 2024  2:35 PM Kita Perish H wrote: Medication: valACYclovir  (VALTREX ) 500 MG tablet  Has the patient contacted their pharmacy? Yes, new pharmacy (Agent: If no, request that the patient contact the pharmacy for the refill. If patient does not wish to contact the pharmacy document the reason why and proceed with request.) (Agent: If yes, when and what did the pharmacy advise?)  This is the patient's preferred pharmacy:  [id number] - [place], [place] - [address] AT [facility] OF [address] [place]  [address] Phone: [phone] Fax: [phone]    Is this the correct pharmacy for this prescription? Yes If no, delete pharmacy and type the correct one.   Has the prescription been filled recently? No  Is the patient out of the medication? Yes  Has the patient been seen for an appointment in the last year OR does the patient have an upcoming appointment? Yes, Agent scheduling appt  Can we respond through MyChart? Yes  Agent: Please be advised that Rx refills may take up to 3 business days. We ask that you follow-up with your pharmacy.

## 2024-01-15 ENCOUNTER — Ambulatory Visit: Payer: Self-pay | Admitting: Internal Medicine

## 2024-01-15 ENCOUNTER — Ambulatory Visit: Payer: PRIVATE HEALTH INSURANCE | Admitting: Internal Medicine

## 2024-01-15 ENCOUNTER — Ambulatory Visit (INDEPENDENT_AMBULATORY_CARE_PROVIDER_SITE_OTHER): Payer: PRIVATE HEALTH INSURANCE

## 2024-01-15 VITALS — BP 130/74 | HR 89 | Temp 98.3°F | Ht 68.0 in | Wt 172.0 lb

## 2024-01-15 DIAGNOSIS — F419 Anxiety disorder, unspecified: Secondary | ICD-10-CM

## 2024-01-15 DIAGNOSIS — E7800 Pure hypercholesterolemia, unspecified: Secondary | ICD-10-CM

## 2024-01-15 DIAGNOSIS — E538 Deficiency of other specified B group vitamins: Secondary | ICD-10-CM | POA: Insufficient documentation

## 2024-01-15 DIAGNOSIS — E559 Vitamin D deficiency, unspecified: Secondary | ICD-10-CM | POA: Insufficient documentation

## 2024-01-15 DIAGNOSIS — R739 Hyperglycemia, unspecified: Secondary | ICD-10-CM | POA: Diagnosis not present

## 2024-01-15 DIAGNOSIS — F32A Depression, unspecified: Secondary | ICD-10-CM | POA: Insufficient documentation

## 2024-01-15 DIAGNOSIS — R9389 Abnormal findings on diagnostic imaging of other specified body structures: Secondary | ICD-10-CM | POA: Insufficient documentation

## 2024-01-15 DIAGNOSIS — Z0001 Encounter for general adult medical examination with abnormal findings: Secondary | ICD-10-CM

## 2024-01-15 DIAGNOSIS — F988 Other specified behavioral and emotional disorders with onset usually occurring in childhood and adolescence: Secondary | ICD-10-CM

## 2024-01-15 DIAGNOSIS — Z125 Encounter for screening for malignant neoplasm of prostate: Secondary | ICD-10-CM | POA: Diagnosis not present

## 2024-01-15 DIAGNOSIS — Z0000 Encounter for general adult medical examination without abnormal findings: Secondary | ICD-10-CM | POA: Diagnosis not present

## 2024-01-15 LAB — CBC WITH DIFFERENTIAL/PLATELET
Basophils Absolute: 0.1 10*3/uL (ref 0.0–0.1)
Basophils Relative: 2.1 % (ref 0.0–3.0)
Eosinophils Absolute: 0.6 10*3/uL (ref 0.0–0.7)
Eosinophils Relative: 10 % — ABNORMAL HIGH (ref 0.0–5.0)
HCT: 44.8 % (ref 39.0–52.0)
Hemoglobin: 15.4 g/dL (ref 13.0–17.0)
Lymphocytes Relative: 24.2 % (ref 12.0–46.0)
Lymphs Abs: 1.4 10*3/uL (ref 0.7–4.0)
MCHC: 34.4 g/dL (ref 30.0–36.0)
MCV: 84.4 fl (ref 78.0–100.0)
Monocytes Absolute: 0.6 10*3/uL (ref 0.1–1.0)
Monocytes Relative: 10.6 % (ref 3.0–12.0)
Neutro Abs: 3 10*3/uL (ref 1.4–7.7)
Neutrophils Relative %: 53.1 % (ref 43.0–77.0)
Platelets: 223 10*3/uL (ref 150.0–400.0)
RBC: 5.31 Mil/uL (ref 4.22–5.81)
RDW: 13.3 % (ref 11.5–15.5)
WBC: 5.7 10*3/uL (ref 4.0–10.5)

## 2024-01-15 LAB — BASIC METABOLIC PANEL WITH GFR
BUN: 16 mg/dL (ref 6–23)
CO2: 29 meq/L (ref 19–32)
Calcium: 9.5 mg/dL (ref 8.4–10.5)
Chloride: 104 meq/L (ref 96–112)
Creatinine, Ser: 1.14 mg/dL (ref 0.40–1.50)
GFR: 79.83 mL/min (ref >60.00)
Glucose, Bld: 88 mg/dL (ref 70–99)
Potassium: 4.9 meq/L (ref 3.5–5.1)
Sodium: 139 meq/L (ref 135–145)

## 2024-01-15 LAB — PSA: PSA: 1.52 ng/mL (ref 0.10–4.00)

## 2024-01-15 LAB — URINALYSIS, ROUTINE W REFLEX MICROSCOPIC
Bilirubin Urine: NEGATIVE
Hgb urine dipstick: NEGATIVE
Ketones, ur: NEGATIVE
Leukocytes,Ua: NEGATIVE
Nitrite: NEGATIVE
RBC / HPF: NONE SEEN (ref 0–?)
Specific Gravity, Urine: 1.01 (ref 1.000–1.030)
Total Protein, Urine: NEGATIVE
Urine Glucose: NEGATIVE
Urobilinogen, UA: 0.2 (ref 0.0–1.0)
WBC, UA: NONE SEEN (ref 0–?)
pH: 6 (ref 5.0–8.0)

## 2024-01-15 LAB — LIPID PANEL
Cholesterol: 164 mg/dL (ref 0–200)
HDL: 60.1 mg/dL (ref >39.00)
LDL Cholesterol: 84 mg/dL (ref 0–99)
NonHDL: 104.32
Total CHOL/HDL Ratio: 3
Triglycerides: 104 mg/dL (ref 0.0–149.0)
VLDL: 20.8 mg/dL (ref 0.0–40.0)

## 2024-01-15 LAB — HEPATIC FUNCTION PANEL
ALT: 20 U/L (ref 0–53)
AST: 16 U/L (ref 0–37)
Albumin: 4.6 g/dL (ref 3.5–5.2)
Alkaline Phosphatase: 72 U/L (ref 39–117)
Bilirubin, Direct: 0.1 mg/dL (ref 0.0–0.3)
Total Bilirubin: 0.5 mg/dL (ref 0.2–1.2)
Total Protein: 7.2 g/dL (ref 6.0–8.3)

## 2024-01-15 LAB — VITAMIN B12: Vitamin B-12: 236 pg/mL (ref 211–911)

## 2024-01-15 LAB — VITAMIN D 25 HYDROXY (VIT D DEFICIENCY, FRACTURES): VITD: 28.83 ng/mL — ABNORMAL LOW (ref 30.00–100.00)

## 2024-01-15 LAB — HEMOGLOBIN A1C: Hgb A1c MFr Bld: 5.1 % (ref 4.6–6.5)

## 2024-01-15 LAB — TSH: TSH: 1.55 u[IU]/mL (ref 0.35–5.50)

## 2024-01-15 MED ORDER — AMPHETAMINE-DEXTROAMPHETAMINE 20 MG PO TABS: 20.0000 mg | ORAL_TABLET | Freq: Two times a day (BID) | ORAL | 0 refills | Status: DC

## 2024-01-15 MED ORDER — CITALOPRAM HYDROBROMIDE 20 MG PO TABS: 20.0000 mg | ORAL_TABLET | Freq: Every day | ORAL | 3 refills | Status: AC

## 2024-01-15 NOTE — Progress Notes (Unsigned)
 Patient ID: Clayton Olson, male   DOB: May 18, 1981, 43 y.o.   MRN: [id number]         Chief Complaint:: wellness exam and depression , anxiety, ADD, reported abnormal cxr with persistent cough       HPI:  Clayton Olson is a 43 y.o. male here for wellness exam; up to date                        Also s/p several deaths recently - mom, brother, nephew in past 3 yrs (nephew by suicide).  Now has had mild to mod worsening depressive symptoms, but no suicidal ideation, or panic; has ongoing anxiety with numerous social stressors.  [facility], records not availabe but pt has cough for several years, requests cxr here.   ADD med working well enough, now promoted to Production designer, theatre/television/film at [employer].  Pt denies chest pain, increased sob or doe, wheezing, orthopnea, PND, increased LE swelling, palpitations, dizziness or syncope.   Pt denies polydipsia, polyuria, or new focal neuro s/s.    Pt denies fever, wt loss, night sweats, loss of appetite, or other constitutional symptoms  Wt Readings from Last 3 Encounters:  12/12/22 175 lb (79.4 kg)  04/11/22 169 lb (76.7 kg)  04/30/21 179 lb (81.2 kg)   BP Readings from Last 3 Encounters:  12/12/22 120/74  04/11/22 132/80  04/30/21 126/80   Immunization History  Administered Date(s) Administered   Influenza-Unspecified 04/28/2018   PFIZER(Purple Top)SARS-COV-2 Vaccination 08/04/2020, 08/25/2020   Tdap 04/24/2014   Health Maintenance Due  Topic Date Due   HPV VACCINES (1 - Risk 3-dose SCDM series) Never done      Past Medical History:  Diagnosis Date   ADD (attention deficit disorder)    Allergy    Genital herpes 09/02/2019   Hemorrhoids    Hypertension    Past Surgical History:  Procedure Laterality Date   INGUINAL HERNIA REPAIR     Right    reports that he has quit smoking. His smoking use included cigarettes. He has never used smokeless tobacco. He reports current alcohol use. He reports that he does not use  drugs. family history includes Alcohol abuse in an other family member; Arthritis in his brother and mother; Breast cancer in an other family member; Colon cancer in an other family member; Depression in his brother and mother; Diabetes in an other family member; Hearing loss in his father; Heart disease in an other family member; Hypertension in his brother, father, and mother; Kidney disease in an other family member; Stroke in an other family member. Allergies  Allergen Reactions   Corylus Anaphylaxis   Peanuts [Peanut Oil]     Hazel Nuts   Current Outpatient Medications on File Prior to Visit  Medication Sig Dispense Refill   amphetamine -dextroamphetamine  (ADDERALL) 20 MG tablet Take 1 tablet (20 mg total) by mouth 2 (two) times daily. 60 tablet 0   Dietary Management Product (NEOKE BCAA4 PO) Take by mouth.     fexofenadine (ALLEGRA) 180 MG tablet Take by mouth.     Multiple Vitamin (MULTIVITAMIN) tablet Take 1 tablet by mouth daily.     sildenafil  (VIAGRA ) 100 MG tablet Take 0.5-1 tablets (50-100 mg total) by mouth daily as needed for erectile dysfunction. 10 tablet 11   triamcinolone  cream (KENALOG ) 0.1 % SMARTSIG:Topical 1-2 Times Daily PRN     valACYclovir  (VALTREX ) 500 MG tablet Take 2 tablets (1,000 mg  total) by mouth 2 (two) times daily. 28 tablet 5   ZYLET 0.5-0.3 % SUSP Place 1 drop into the right eye 3 (three) times daily.     No current facility-administered medications on file prior to visit.        ROS:  All others reviewed and negative.  Objective        PE:  There were no vitals taken for this visit.                Constitutional: Pt appears in NAD               HENT: Head: NCAT.                Right Ear: External ear normal.                 Left Ear: External ear normal.                Eyes: . Pupils are equal, round, and reactive to light. Conjunctivae and EOM are normal               Nose: without d/c or deformity               Neck: Neck supple. Gross normal  ROM               Cardiovascular: Normal rate and regular rhythm.                 Pulmonary/Chest: Effort normal and breath sounds without rales or wheezing.                Abd:  Soft, NT, ND, + BS, no organomegaly               Neurological: Pt is alert. At baseline orientation, motor grossly intact               Skin: Skin is warm. No rashes, no other new lesions, LE edema - none               Psychiatric: Pt behavior is normal without agitation , nervous depressed  Micro: none  Cardiac tracings I have personally interpreted today:  none  Pertinent Radiological findings (summarize): none   Lab Results  Component Value Date   WBC 4.7 12/12/2022   HGB 16.4 12/12/2022   HCT 46.8 12/12/2022   PLT 211.0 12/12/2022   GLUCOSE 85 12/12/2022   CHOL 157 12/12/2022   TRIG 98.0 12/12/2022   HDL 60.80 12/12/2022   LDLCALC 77 12/12/2022   ALT 19 12/12/2022   AST 18 12/12/2022   NA 139 12/12/2022   K 4.1 12/12/2022   CL 103 12/12/2022   CREATININE 1.03 12/12/2022   BUN 12 12/12/2022   CO2 30 12/12/2022   TSH 2.02 12/12/2022   PSA 1.45 12/12/2022   HGBA1C 5.1 12/12/2022   Assessment/Plan:  Clayton Olson is a 43 y.o. White or Caucasian [1] male with  has a past medical history of ADD (attention deficit disorder), Allergy, Genital herpes (09/02/2019), Hemorrhoids, and Hypertension.  No problem-specific Assessment & Plan notes found for this encounter.  Followup: No follow-ups on file.  Clayton Colonel, MD 01/15/2024 8:45 AM [facility]  Primary Care - [facility]

## 2024-01-15 NOTE — Patient Instructions (Addendum)
 Please take all new medication as prescribed  - the celexa 20 mg per day  Please continue all other medications as before, and refills have been done if requested.  Please have the pharmacy call with any other refills you may need.  Please continue your efforts at being more active, low cholesterol diet, and weight control.  You are otherwise up to date with prevention measures today.  Please keep your appointments with your specialists as you may have planned  Please go to the XRAY Department in the first floor for the x-ray testing  Please go to the LAB at the blood drawing area for the tests to be done  You will be contacted by phone if any changes need to be made immediately.  Otherwise, you will receive a letter about your results with an explanation, but please check with MyChart first.  Please make an Appointment to return for your 1 year visit, or sooner if needed

## 2024-01-18 ENCOUNTER — Encounter: Payer: Self-pay | Admitting: Internal Medicine

## 2024-01-18 NOTE — Assessment & Plan Note (Signed)
 Lab Results  Component Value Date   LDLCALC 84 01/15/2024   Stable, pt to continue lower chol diet

## 2024-01-18 NOTE — Assessment & Plan Note (Signed)

## 2024-01-18 NOTE — Assessment & Plan Note (Signed)
 Last vitamin D  Lab Results  Component Value Date   VD25OH 28.83 (L) 01/15/2024   Low, to start oral replacement

## 2024-01-18 NOTE — Assessment & Plan Note (Signed)
Stable overall, continue adderall

## 2024-01-18 NOTE — Assessment & Plan Note (Signed)
 Lab Results  Component Value Date   VITAMINB12 236 01/15/2024   Low, to start oral replacement - b12 1000 mcg qd

## 2024-01-18 NOTE — Assessment & Plan Note (Signed)
 By pt report done I believe for screening purpose at a recent overnight jail stay - for f/u cxr today

## 2024-01-18 NOTE — Assessment & Plan Note (Signed)
 With mild worsening, multiple social stressors, for celexa  20 every day, declines referral counselnig

## 2024-02-27 ENCOUNTER — Encounter: Payer: Self-pay | Admitting: Internal Medicine

## 2024-02-27 NOTE — Telephone Encounter (Signed)
 Copied from CRM [id number]. Topic: Clinical - Medication Refill >> Feb 27, 2024  5:34 PM Harlene ORN wrote: Medication: amphetamine -dextroamphetamine  (ADDERALL) 20 MG tablet  Has the patient contacted their pharmacy? Yes (Agent: If no, request that the patient contact the pharmacy for the refill. If patient does not wish to contact the pharmacy document the reason why and proceed with request.) (Agent: If yes, when and what did the pharmacy advise?)  This is the patient's preferred pharmacy:  [id number] - [place], [place] - [address] AT [facility] OF [address] [place] [address] Phone: [phone] Fax: [phone]    Is this the correct pharmacy for this prescription? Yes If no, delete pharmacy and type the correct one.   Has the prescription been filled recently? No  Is the patient out of the medication? No  Has the patient been seen for an appointment in the last year OR does the patient have an upcoming appointment? Yes  Can we respond through MyChart? Yes  Agent: Please be advised that Rx refills may take up to 3 business days. We ask that you follow-up with your pharmacy.

## 2024-03-01 MED ORDER — AMPHETAMINE-DEXTROAMPHETAMINE 20 MG PO TABS: 20.0000 mg | ORAL_TABLET | Freq: Two times a day (BID) | ORAL | 0 refills | Status: DC

## 2024-04-29 ENCOUNTER — Telehealth: Payer: Self-pay | Admitting: Internal Medicine

## 2024-04-29 NOTE — Telephone Encounter (Unsigned)
 Copied from CRM [id number]. Topic: Clinical - Medication Refill >> Apr 29, 2024  4:44 PM Harlene ORN wrote: Medication: amphetamine -dextroamphetamine  (ADDERALL) 20 MG tablet  Has the patient contacted their pharmacy? Yes (Agent: If no, request that the patient contact the pharmacy for the refill. If patient does not wish to contact the pharmacy document the reason why and proceed with request.) (Agent: If yes, when and what did the pharmacy advise?)  This is the patient's preferred pharmacy:   [facility] [phone] - [place], [place] - [phone] AT [facility] OF [address] [place] [address] Phone: [phone] Fax: [phone]  Is this the correct pharmacy for this prescription? Yes If no, delete pharmacy and type the correct one.   Has the prescription been filled recently? Yes  Is the patient out of the medication? Yes  Has the patient been seen for an appointment in the last year OR does the patient have an upcoming appointment? Yes  Can we respond through MyChart? Yes  Agent: Please be advised that Rx refills may take up to 3 business days. We ask that you follow-up with your pharmacy.

## 2024-04-30 MED ORDER — AMPHETAMINE-DEXTROAMPHETAMINE 20 MG PO TABS: 20.0000 mg | ORAL_TABLET | Freq: Two times a day (BID) | ORAL | 0 refills | Status: DC

## 2024-06-04 ENCOUNTER — Encounter: Payer: Self-pay | Admitting: Internal Medicine

## 2024-06-04 MED ORDER — AMPHETAMINE-DEXTROAMPHETAMINE 20 MG PO TABS: 20.0000 mg | ORAL_TABLET | Freq: Two times a day (BID) | ORAL | 0 refills | Status: DC

## 2024-08-09 ENCOUNTER — Other Ambulatory Visit: Payer: Self-pay | Admitting: Internal Medicine

## 2024-08-09 MED ORDER — AMPHETAMINE-DEXTROAMPHETAMINE 20 MG PO TABS: 20.0000 mg | ORAL_TABLET | Freq: Two times a day (BID) | ORAL | 0 refills | Status: AC

## 2024-08-09 NOTE — Telephone Encounter (Signed)
 Copied from CRM [id number]. Topic: Clinical - Medication Refill >> Aug 09, 2024 10:30 AM Antwanette L wrote: Medication: amphetamine -dextroamphetamine  (ADDERALL) 20 MG tablet  Has the patient contacted their pharmacy? Yes   This is the patient's preferred pharmacy:  [id number] - [place], [place] - [address] AT [facility] OF [address] [place] [address] Phone: [phone] Fax: [phone]   Is this the correct pharmacy for this prescription? Yes    Has the prescription been filled recently? Yes. Last refill was 06/04/24  Is the patient out of the medication? Yes  Has the patient been seen for an appointment in the last year OR does the patient have an upcoming appointment? Yes . Last office visit with Dr. Lynwood Rush was 01/15/24  Can we respond through MyChart? Yes and by phone at [phone]   Agent: Please be advised that Rx refills may take up to 3 business days. We ask that you follow-up with your pharmacy.

## 2024-08-10 ENCOUNTER — Telehealth: Payer: Self-pay

## 2024-08-10 NOTE — Telephone Encounter (Signed)
 Copied from CRM #[id number]. Topic: Clinical - Medication Prior Auth >> Aug 10, 2024  9:48 AM Aleatha BROCKS wrote: Reason for CRM: [facility] need a P/A for patient amphetamine -dextroamphetamine  (ADDERALL) 20 MG tablet [id number] - [place], [place] - [address] AT [facility] OF [address] [place] [address] Phone: [phone] Fax: [phone]

## 2024-08-12 ENCOUNTER — Telehealth: Payer: Self-pay

## 2024-08-12 ENCOUNTER — Other Ambulatory Visit (HOSPITAL_COMMUNITY): Payer: Self-pay

## 2024-08-12 NOTE — Telephone Encounter (Signed)
 Pharmacy Patient Advocate Encounter   Received notification from Physician's Office that prior authorization for Amphetamine -Dextroamphetamine  20MG  tablets is required/requested.   Insurance verification completed.   The patient is insured through [facility].   Per test claim: PA required; PA submitted to above mentioned insurance via Latent Key/confirmation #/EOC [id number] Status is pending

## 2024-08-16 ENCOUNTER — Other Ambulatory Visit (HOSPITAL_COMMUNITY): Payer: Self-pay

## 2024-08-16 NOTE — Telephone Encounter (Signed)
 Pharmacy Patient Advocate Encounter  Received notification from [employer] that Prior Authorization forAmphetamine-Dextroamphetamine  20MG  tablets has been APPROVED from 08/12/2024 to 08/12/2025. Unable to obtain price due to refill too soon rejection, last fill date 08/15/2024 next available fill date 09/11/2024   PA #/Case ID/Reference #: [id number]

## 2024-08-25 NOTE — Telephone Encounter (Signed)
 Addressed in other encounter.

## 2024-09-06 ENCOUNTER — Encounter: Payer: PRIVATE HEALTH INSURANCE | Admitting: Physician Assistant
